# Patient Record
Sex: Female | Born: 1970 | Race: White | Hispanic: No | Marital: Married | Smoking: Never smoker
Health system: Southern US, Community
[De-identification: ages and names within clinical notes are randomized; demographics above are authoritative.]

## PROBLEM LIST (undated history)

## (undated) HISTORY — PX: HYSTEROSCOPY WITH D & C: SHX1775

## (undated) HISTORY — PX: ENDOMETRIAL ABLATION: SHX621

---

## 2010-05-12 ENCOUNTER — Ambulatory Visit: Payer: Self-pay | Admitting: Obstetrics & Gynecology

## 2010-05-18 ENCOUNTER — Ambulatory Visit: Payer: Self-pay | Admitting: Obstetrics & Gynecology

## 2010-05-20 LAB — PATHOLOGY REPORT

## 2014-10-02 LAB — HM PAP SMEAR

## 2019-07-24 ENCOUNTER — Other Ambulatory Visit (HOSPITAL_COMMUNITY)
Admission: RE | Admit: 2019-07-24 | Discharge: 2019-07-24 | Disposition: A | Discharge status: Home / Self Care | Payer: BC Managed Care – PPO | Source: Ambulatory Visit | Attending: Obstetrics and Gynecology | Admitting: Obstetrics and Gynecology

## 2019-07-24 ENCOUNTER — Encounter: Payer: Self-pay | Admitting: Obstetrics and Gynecology

## 2019-07-24 ENCOUNTER — Ambulatory Visit (INDEPENDENT_AMBULATORY_CARE_PROVIDER_SITE_OTHER): Payer: BC Managed Care – PPO | Admitting: Obstetrics and Gynecology

## 2019-07-24 ENCOUNTER — Other Ambulatory Visit: Payer: Self-pay

## 2019-07-24 VITALS — BP 100/70 | Ht 66.0 in | Wt 181.0 lb

## 2019-07-24 DIAGNOSIS — Z124 Encounter for screening for malignant neoplasm of cervix: Secondary | ICD-10-CM | POA: Insufficient documentation

## 2019-07-24 DIAGNOSIS — Z6829 Body mass index (BMI) 29.0-29.9, adult: Secondary | ICD-10-CM

## 2019-07-24 DIAGNOSIS — Z13220 Encounter for screening for lipoid disorders: Secondary | ICD-10-CM

## 2019-07-24 DIAGNOSIS — Z01419 Encounter for gynecological examination (general) (routine) without abnormal findings: Secondary | ICD-10-CM

## 2019-07-24 DIAGNOSIS — Z131 Encounter for screening for diabetes mellitus: Secondary | ICD-10-CM

## 2019-07-24 DIAGNOSIS — Z0000 Encounter for general adult medical examination without abnormal findings: Secondary | ICD-10-CM

## 2019-07-24 DIAGNOSIS — Z1151 Encounter for screening for human papillomavirus (HPV): Secondary | ICD-10-CM | POA: Insufficient documentation

## 2019-07-24 DIAGNOSIS — Z1231 Encounter for screening mammogram for malignant neoplasm of breast: Secondary | ICD-10-CM

## 2019-07-24 NOTE — Progress Notes (Signed)
PCP:  Patient, No Pcp Per   Chief Complaint  Patient presents with  . Gynecologic Exam     HPI:      Ms. Leslie Carlson is a 48 y.o. No obstetric history on file. who LMP was Patient's last menstrual period was 07/19/2019 (exact date)., presents today for her NP> 3 yrs annual examination.  Her menses are monthly, last 2-3 days light flow, due to endometrial ablation. Dysmenorrhea mild, occurring first 1-2 days of flow. She does not have intermenstrual bleeding.  Sex activity: single partner, contraception - vasectomy.  Last Pap: October 02, 2014  Results were: no abnormalities /neg HPV DNA  Hx of STDs: none  Last mammogram: November 11, 2015  Results were: normal--routine follow-up in 12 months There is no FH of breast cancer. There is no FH of ovarian cancer. The patient does do self-breast exams.  Tobacco use: The patient denies current or previous tobacco use. Alcohol use: none No drug use.  Exercise: very active  She does get adequate calcium and Vitamin D in her diet.  Due for fasting labs.  History reviewed. No pertinent past medical history.  Past Surgical History:  Procedure Laterality Date  . ENDOMETRIAL ABLATION    . HYSTEROSCOPY W/D&C      Family History  Problem Relation Age of Onset  . Heart disease Father 38    Social History   Socioeconomic History  . Marital status: Unknown    Spouse name: Not on file  . Number of children: Not on file  . Years of education: Not on file  . Highest education level: Not on file  Occupational History  . Not on file  Social Needs  . Financial resource strain: Not on file  . Food insecurity    Worry: Not on file    Inability: Not on file  . Transportation needs    Medical: Not on file    Non-medical: Not on file  Tobacco Use  . Smoking status: Never Smoker  . Smokeless tobacco: Never Used  Substance and Sexual Activity  . Alcohol use: Never    Frequency: Never  . Drug use: Never  . Sexual activity:  Yes    Birth control/protection: None, Surgical    Comment: Ablation/Vasectomy  Lifestyle  . Physical activity    Days per week: Not on file    Minutes per session: Not on file  . Stress: Not on file  Relationships  . Social Herbalist on phone: Not on file    Gets together: Not on file    Attends religious service: Not on file    Active member of club or organization: Not on file    Attends meetings of clubs or organizations: Not on file    Relationship status: Not on file  . Intimate partner violence    Fear of current or ex partner: Not on file    Emotionally abused: Not on file    Physically abused: Not on file    Forced sexual activity: Not on file  Other Topics Concern  . Not on file  Social History Narrative  . Not on file    No current outpatient medications on file.     ROS:  Review of Systems  Constitutional: Negative for fatigue, fever and unexpected weight change.  Respiratory: Negative for cough, shortness of breath and wheezing.   Cardiovascular: Negative for chest pain, palpitations and leg swelling.  Gastrointestinal: Negative for blood in stool, constipation, diarrhea,  nausea and vomiting.  Endocrine: Negative for cold intolerance, heat intolerance and polyuria.  Genitourinary: Negative for dyspareunia, dysuria, flank pain, frequency, genital sores, hematuria, menstrual problem, pelvic pain, urgency, vaginal bleeding, vaginal discharge and vaginal pain.  Musculoskeletal: Negative for back pain, joint swelling and myalgias.  Skin: Negative for rash.  Neurological: Negative for dizziness, syncope, light-headedness, numbness and headaches.  Hematological: Negative for adenopathy.  Psychiatric/Behavioral: Negative for agitation, confusion, sleep disturbance and suicidal ideas. The patient is not nervous/anxious.    BREAST: No symptoms   Objective: BP 100/70   Ht 5' 6"  (1.676 m)   Wt 181 lb (82.1 kg)   LMP 07/19/2019 (Exact Date)   BMI  29.21 kg/m    Physical Exam Constitutional:      Appearance: She is well-developed.  Genitourinary:     Vulva, vagina, cervix, uterus, right adnexa and left adnexa normal.     No vulval lesion or tenderness noted.     No vaginal discharge, erythema or tenderness.     No cervical polyp.     Uterus is not enlarged or tender.     No right or left adnexal mass present.     Right adnexa not tender.     Left adnexa not tender.  Neck:     Musculoskeletal: Normal range of motion.     Thyroid: No thyromegaly.  Cardiovascular:     Rate and Rhythm: Normal rate and regular rhythm.     Heart sounds: Normal heart sounds. No murmur.  Pulmonary:     Effort: Pulmonary effort is normal.     Breath sounds: Normal breath sounds.  Chest:     Breasts:        Right: No mass, nipple discharge, skin change or tenderness.        Left: No mass, nipple discharge, skin change or tenderness.  Abdominal:     Palpations: Abdomen is soft.     Tenderness: There is no abdominal tenderness. There is no guarding.  Musculoskeletal: Normal range of motion.  Neurological:     General: No focal deficit present.     Mental Status: She is alert and oriented to person, place, and time.     Cranial Nerves: No cranial nerve deficit.  Skin:    General: Skin is warm and dry.  Psychiatric:        Mood and Affect: Mood normal.        Behavior: Behavior normal.        Thought Content: Thought content normal.        Judgment: Judgment normal.  Vitals signs reviewed.     Assessment/Plan: Encounter for annual routine gynecological examination  Cervical cancer screening - Plan: Cytology - PAP  Screening for HPV (human papillomavirus) - Plan: Cytology - PAP  Encounter for screening mammogram for malignant neoplasm of breast - Plan: MM 3D SCREEN BREAST BILATERAL; pt to sched mammo  Blood tests for routine general physical examination - Plan: Comprehensive metabolic panel, Lipid panel, Hemoglobin A1c  Screening  cholesterol level - Plan: Lipid panel  Screening for diabetes mellitus - Plan: Hemoglobin A1c  BMI 29.0-29.9,adult - Plan: Comprehensive metabolic panel, Lipid panel, Hemoglobin A1c     GYN counsel breast self exam, mammography screening, menopause, adequate intake of calcium and vitamin D, diet and exercise     F/U  Return in about 1 year (around 07/23/2020).  Nekoda Chock B. Deionna Marcantonio, PA-C 07/24/2019 3:24 PM

## 2019-07-24 NOTE — Patient Instructions (Signed)
I value your feedback and entrusting [place] with your care. If you get a  patient survey, I would appreciate you taking the time to let us know about your experience today. Thank you!  [place] [facility]: [place]: [phone]

## 2019-07-25 ENCOUNTER — Other Ambulatory Visit: Payer: Self-pay

## 2019-07-25 DIAGNOSIS — Z131 Encounter for screening for diabetes mellitus: Secondary | ICD-10-CM

## 2019-07-25 DIAGNOSIS — Z0000 Encounter for general adult medical examination without abnormal findings: Secondary | ICD-10-CM

## 2019-07-25 DIAGNOSIS — Z13220 Encounter for screening for lipoid disorders: Secondary | ICD-10-CM

## 2019-07-25 DIAGNOSIS — Z6829 Body mass index (BMI) 29.0-29.9, adult: Secondary | ICD-10-CM

## 2019-07-30 LAB — CYTOLOGY - PAP
Diagnosis: NEGATIVE
High risk HPV: NEGATIVE

## 2019-08-01 ENCOUNTER — Other Ambulatory Visit: Payer: Self-pay | Admitting: Obstetrics and Gynecology

## 2019-08-02 LAB — COMPREHENSIVE METABOLIC PANEL
ALT: 13 IU/L (ref 0–32)
AST: 14 IU/L (ref 0–40)
Albumin/Globulin Ratio: 1.9 (ref 1.2–2.2)
Albumin: 4.4 g/dL (ref 3.8–4.8)
Alkaline Phosphatase: 43 IU/L (ref 39–117)
BUN/Creatinine Ratio: 15 (ref 9–23)
BUN: 11 mg/dL (ref 6–24)
Bilirubin Total: 0.2 mg/dL (ref 0.0–1.2)
CO2: 26 mmol/L (ref 20–29)
Calcium: 9.8 mg/dL (ref 8.7–10.2)
Chloride: 103 mmol/L (ref 96–106)
Creatinine, Ser: 0.74 mg/dL (ref 0.57–1.00)
GFR calc Af Amer: 112 mL/min/{1.73_m2} (ref >59)
GFR calc non Af Amer: 97 mL/min/{1.73_m2} (ref >59)
Globulin, Total: 2.3 g/dL (ref 1.5–4.5)
Glucose: 92 mg/dL (ref 65–99)
Potassium: 4.6 mmol/L (ref 3.5–5.2)
Sodium: 140 mmol/L (ref 134–144)
Total Protein: 6.7 g/dL (ref 6.0–8.5)

## 2019-08-02 LAB — LIPID PANEL
Chol/HDL Ratio: 3.4 ratio (ref 0.0–4.4)
Cholesterol, Total: 180 mg/dL (ref 100–199)
HDL: 53 mg/dL (ref >39)
LDL Chol Calc (NIH): 112 mg/dL — ABNORMAL HIGH (ref 0–99)
Triglycerides: 78 mg/dL (ref 0–149)
VLDL Cholesterol Cal: 15 mg/dL (ref 5–40)

## 2019-08-02 LAB — HEMOGLOBIN A1C
Est. average glucose Bld gHb Est-mCnc: 105 mg/dL
Hgb A1c MFr Bld: 5.3 % (ref 4.8–5.6)

## 2019-08-02 NOTE — Progress Notes (Signed)
Pls let pt know labs normal. Thx

## 2019-08-02 NOTE — Progress Notes (Signed)
Called pt, no answer, LVMTRC.

## 2019-08-02 NOTE — Progress Notes (Signed)
Pt aware.

## 2019-08-07 ENCOUNTER — Other Ambulatory Visit: Payer: Self-pay

## 2019-08-07 ENCOUNTER — Ambulatory Visit
Admission: RE | Admit: 2019-08-07 | Discharge: 2019-08-07 | Disposition: A | Discharge status: Home / Self Care | Payer: BC Managed Care – PPO | Source: Ambulatory Visit | Attending: Obstetrics and Gynecology | Admitting: Obstetrics and Gynecology

## 2019-08-07 DIAGNOSIS — Z1231 Encounter for screening mammogram for malignant neoplasm of breast: Secondary | ICD-10-CM | POA: Diagnosis present

## 2019-08-14 ENCOUNTER — Other Ambulatory Visit: Payer: Self-pay | Admitting: Obstetrics and Gynecology

## 2019-08-14 DIAGNOSIS — N6489 Other specified disorders of breast: Secondary | ICD-10-CM

## 2019-08-14 DIAGNOSIS — R928 Other abnormal and inconclusive findings on diagnostic imaging of breast: Secondary | ICD-10-CM

## 2019-08-22 ENCOUNTER — Ambulatory Visit
Admission: RE | Admit: 2019-08-22 | Discharge: 2019-08-22 | Disposition: A | Discharge status: Home / Self Care | Payer: BC Managed Care – PPO | Source: Ambulatory Visit | Attending: Obstetrics and Gynecology | Admitting: Obstetrics and Gynecology

## 2019-08-22 DIAGNOSIS — R928 Other abnormal and inconclusive findings on diagnostic imaging of breast: Secondary | ICD-10-CM

## 2019-08-22 DIAGNOSIS — N6489 Other specified disorders of breast: Secondary | ICD-10-CM

## 2019-08-27 ENCOUNTER — Encounter: Payer: Self-pay | Admitting: Obstetrics and Gynecology

## 2019-12-21 ENCOUNTER — Ambulatory Visit: Payer: BC Managed Care – PPO | Attending: Internal Medicine

## 2019-12-21 DIAGNOSIS — Z23 Encounter for immunization: Secondary | ICD-10-CM | POA: Insufficient documentation

## 2019-12-21 NOTE — Progress Notes (Signed)
   Covid-19 Vaccination Clinic  Name:  Leslie Carlson    MRN: [id number] DOB: 10-04-1971  12/21/2019  Leslie Carlson was observed post Covid-19 immunization for 15 minutes without incident. She was provided with Vaccine Information Sheet and instruction to access the V-Safe system.   Leslie Carlson was instructed to call 911 with any severe reactions post vaccine: Marland Kitchen Difficulty breathing  . Swelling of face and throat  . A fast heartbeat  . A bad rash all over body  . Dizziness and weakness   Immunizations Administered    Name Date Dose VIS Date Route   Moderna COVID-19 Vaccine 12/21/2019  2:18 PM 0.5 mL 09/17/2019 Intramuscular   Manufacturer: Moderna   Lot: [id number]   [place]: [id number]

## 2020-01-07 ENCOUNTER — Other Ambulatory Visit: Payer: Self-pay | Admitting: Obstetrics and Gynecology

## 2020-01-07 ENCOUNTER — Telehealth: Payer: Self-pay

## 2020-01-07 DIAGNOSIS — N6320 Unspecified lump in the left breast, unspecified quadrant: Secondary | ICD-10-CM

## 2020-01-07 NOTE — Telephone Encounter (Signed)
Pt called triage stating that she needs to have a recheck of her breast due to them finding a spot they wanted to check again in 6 months. Pt states she is needing an order from [facility] so that she can have the other images done since it is time for the 6 month check up. Thank you

## 2020-01-07 NOTE — Telephone Encounter (Signed)
Pls notify pt order is in system and she can scheduled at [facility]. Thx.

## 2020-01-08 NOTE — Telephone Encounter (Signed)
Pt aware.

## 2020-01-18 ENCOUNTER — Ambulatory Visit: Payer: BC Managed Care – PPO | Attending: Internal Medicine

## 2020-01-18 ENCOUNTER — Other Ambulatory Visit: Payer: Self-pay

## 2020-01-18 DIAGNOSIS — Z23 Encounter for immunization: Secondary | ICD-10-CM

## 2020-01-18 NOTE — Progress Notes (Signed)
   Covid-19 Vaccination Clinic  Name:  JACQULENE HUNTLEY    MRN: [id number] DOB: 11-07-1970  01/18/2020  Ms. Heffern was observed post Covid-19 immunization for 15 minutes without incident. She was provided with Vaccine Information Sheet and instruction to access the V-Safe system.   Ms. Dowda was instructed to call 911 with any severe reactions post vaccine: Marland Kitchen Difficulty breathing  . Swelling of face and throat  . A fast heartbeat  . A bad rash all over body  . Dizziness and weakness   Immunizations Administered    Name Date Dose VIS Date Route   Moderna COVID-19 Vaccine 01/18/2020  9:06 AM 0.5 mL 09/17/2019 Intramuscular   Manufacturer: Levan Hurst   Lot: [id number]   [place]: [id number]

## 2020-02-20 ENCOUNTER — Encounter: Payer: Self-pay | Admitting: Obstetrics and Gynecology

## 2020-02-20 ENCOUNTER — Ambulatory Visit
Admission: RE | Admit: 2020-02-20 | Discharge: 2020-02-20 | Disposition: A | Discharge status: Home / Self Care | Payer: BC Managed Care – PPO | Source: Ambulatory Visit | Attending: Obstetrics and Gynecology | Admitting: Obstetrics and Gynecology

## 2020-02-20 DIAGNOSIS — N6320 Unspecified lump in the left breast, unspecified quadrant: Secondary | ICD-10-CM

## 2020-04-29 ENCOUNTER — Ambulatory Visit
Admission: EM | Admit: 2020-04-29 | Discharge: 2020-04-29 | Disposition: A | Discharge status: Home / Self Care | Payer: BC Managed Care – PPO | Attending: Family Medicine | Admitting: Family Medicine

## 2020-04-29 ENCOUNTER — Other Ambulatory Visit: Payer: Self-pay

## 2020-04-29 DIAGNOSIS — M5410 Radiculopathy, site unspecified: Secondary | ICD-10-CM

## 2020-04-29 DIAGNOSIS — M545 Low back pain, unspecified: Secondary | ICD-10-CM

## 2020-04-29 MED ORDER — PREDNISONE 10 MG PO TABS: ORAL_TABLET | ORAL | 0 refills | Status: DC

## 2020-04-29 MED ORDER — CYCLOBENZAPRINE HCL 10 MG PO TABS: 10.0000 mg | ORAL_TABLET | Freq: Two times a day (BID) | ORAL | 0 refills | Status: DC | PRN

## 2020-04-29 NOTE — Discharge Instructions (Addendum)
Take medication as prescribed. Rest. Drink plenty of fluids. Ice/Heat. Stretch. Massage. Monitor.   Follow up with your primary care physician this week as needed. Return to Urgent care or ER for new or worsening concerns.

## 2020-04-29 NOTE — ED Triage Notes (Signed)
Patient states that she has a pinched nerve in her back at the base of shoulder blade  that has caused some pain in left arm x 3 days. States that she tried to massage it out with a tennis ball without success.

## 2020-04-29 NOTE — ED Provider Notes (Signed)
[facility] ____________________________________________  Time seen: Approximately 4:12 PM  I have reviewed the triage vital signs and the nursing notes.   HISTORY  Chief Complaint Back Pain  HPI Leslie Carlson is a 49 y.o. female presenting for evaluation of left back pain present for the last 3 to 4 days.  Patient reports initially she woke up with what felt like a crick in her neck.  Patient states she is unsure if she lifted something causing it or slept wrong.  States pain is worse with movement, and reproduced with movement.  States pain is gradually worsened and now sore with movement and palpation near her shoulder blade.  States when she twist her neck to her left she feels the pain shoot down her left arm.  States pain shooting down her arm is aching and sometimes tingly, denies loss of sensation.  Denies decreased range of motion.  States she has had same to happen in the past.  States painful to sleep on her sides at night and has to sleep on her back.  States heating pad and laying on her back at night helps.  Denies fall, direct injury.  Denies any chest pain, shortness of breath, neck pain, pain from back to chest, rash, other injury.  Reports otherwise doing well.  Denies diabetes, hypertension, hyperlipidemia, cardiac history or smoking.  Copland, Deirdre Evener, PA-C : PCP   History reviewed. No pertinent past medical history.  There are no problems to display for this patient.   Past Surgical History:  Procedure Laterality Date  . ENDOMETRIAL ABLATION    . HYSTEROSCOPY WITH D & C       No current facility-administered medications for this encounter.  Current Outpatient Medications:  .  cyclobenzaprine (FLEXERIL) 10 MG tablet, Take 1 tablet (10 mg total) by mouth 2 (two) times daily as needed for muscle spasms. Do not drive while taking as can cause drowsiness, Disp: 15 tablet, Rfl: 0 .  predniSONE (DELTASONE) 10 MG tablet, Start 60 mg po day one, then  50 mg po day two, taper by 10 mg daily until complete., Disp: 21 tablet, Rfl: 0  Allergies Sulfamethoxazole-trimethoprim  Family History  Problem Relation Age of Onset  . Heart disease Father 85  . Breast cancer Neg Hx     Social History Social History   Tobacco Use  . Smoking status: Never Smoker  . Smokeless tobacco: Never Used  Vaping Use  . Vaping Use: Never used  Substance Use Topics  . Alcohol use: Never  . Drug use: Never    Review of Systems Constitutional: No fever Cardiovascular: Denies chest pain. Respiratory: Denies shortness of breath. Gastrointestinal: No abdominal pain.  No nausea, no vomiting.   Musculoskeletal: Positive for back pain. Skin: Negative for rash. Neurological: Negative for headaches or focal weakness.   ____________________________________________   PHYSICAL EXAM:  VITAL SIGNS: ED Triage Vitals  Enc Vitals Group     BP 04/29/20 1531 117/74     Pulse Rate 04/29/20 1531 64     Resp 04/29/20 1531 18     Temp 04/29/20 1531 99 F (37.2 C)     Temp Source 04/29/20 1531 Oral     SpO2 04/29/20 1531 100 %     Weight 04/29/20 1529 180 lb (81.6 kg)     Height 04/29/20 1529 5' 6"  (1.676 m)     Head Circumference --      Peak Flow --      Pain Score 04/29/20  1529 7     Pain Loc --      Pain Edu? --      Excl. in [place]? --     Constitutional: Alert and oriented. Well appearing and in no acute distress. Eyes: Conjunctivae are normal.  ENT      Head: Normocephalic and atraumatic. Cardiovascular: Normal rate, regular rhythm. Grossly normal heart sounds.  Good peripheral circulation. Respiratory: Normal respiratory effort without tachypnea nor retractions. Breath sounds are clear and equal bilaterally. No wheezes, rales, rhonchi. Gastrointestinal: Soft and nontender. No distention. Normal Bowel sounds. No CVA tenderness. Musculoskeletal: Steady gait.  No midline cervical, thoracic or lumbar tenderness palpation.  Bilateral hand grip strong and  equal.  Bilateral distal radial pulses equal. Except: Moderate tenderness to direct palpation medial left scapula with palpable muscle knot as well as tenderness to lower trapezius, no midline tenderness, left arm with full range of motion present and normal distal sensation, pain fully reproducible per patient by direct palpation. Neurologic:  Normal speech and language. No gross focal neurologic deficits are appreciated. Speech is normal. No gait instability.  Skin:  Skin is warm, dry and intact. No rash noted. Psychiatric: Mood and affect are normal. Speech and behavior are normal. Patient exhibits appropriate insight and judgment   ___________________________________________   LABS (all labs ordered are listed, but only abnormal results are displayed)  Labs Reviewed - No data to display ____________________________________________  EKG  Pt declined  RADIOLOGY  No results found. ____________________________________________   PROCEDURES Procedures    INITIAL IMPRESSION / ASSESSMENT AND PLAN / ED COURSE  Pertinent labs & imaging results that were available during my care of the patient were reviewed by me and considered in my medical decision making (see chart for details).  Well-appearing patient.  No acute distress.  Suspect musculoskeletal pain with radiculopathy.  Pain fully reproducible by direct palpation.  Discussed evaluation of EKG at this time, patient declined and verbalized understanding.  Will treat with oral prednisone and Flexeril.  Ice heat.  Massage, stretching, supportive care.  Discussed very strict follow-up and return parameters.Discussed indication, risks and benefits of medications with patient.   Discussed follow up with Primary care physician this week. Discussed follow up and return parameters including no resolution or any worsening concerns. Patient verbalized understanding and agreed to plan.   ____________________________________________   FINAL  CLINICAL IMPRESSION(S) / ED DIAGNOSES  Final diagnoses:  Acute left-sided low back pain without sciatica  Radiculopathy, unspecified spinal region     ED Discharge Orders         Ordered    predniSONE (DELTASONE) 10 MG tablet     Discontinue  Reprint     04/29/20 1610    cyclobenzaprine (FLEXERIL) 10 MG tablet  2 times daily PRN     Discontinue  Reprint     04/29/20 1610           Note: This dictation was prepared with Dragon dictation along with smaller phrase technology. Any transcriptional errors that result from this process are unintentional.         Marylene Land, NP 04/29/20 1630

## 2020-07-17 DIAGNOSIS — Z1211 Encounter for screening for malignant neoplasm of colon: Secondary | ICD-10-CM

## 2020-07-17 HISTORY — DX: Encounter for screening for malignant neoplasm of colon: Z12.11

## 2020-07-28 ENCOUNTER — Encounter: Payer: Self-pay | Admitting: Obstetrics and Gynecology

## 2020-07-28 ENCOUNTER — Ambulatory Visit (INDEPENDENT_AMBULATORY_CARE_PROVIDER_SITE_OTHER): Payer: BC Managed Care – PPO | Admitting: Obstetrics and Gynecology

## 2020-07-28 ENCOUNTER — Other Ambulatory Visit: Payer: Self-pay

## 2020-07-28 VITALS — BP 110/70 | Ht 66.0 in | Wt 183.0 lb

## 2020-07-28 DIAGNOSIS — Z1211 Encounter for screening for malignant neoplasm of colon: Secondary | ICD-10-CM

## 2020-07-28 DIAGNOSIS — Z1231 Encounter for screening mammogram for malignant neoplasm of breast: Secondary | ICD-10-CM

## 2020-07-28 DIAGNOSIS — R928 Other abnormal and inconclusive findings on diagnostic imaging of breast: Secondary | ICD-10-CM

## 2020-07-28 DIAGNOSIS — Z01419 Encounter for gynecological examination (general) (routine) without abnormal findings: Secondary | ICD-10-CM

## 2020-07-28 NOTE — Patient Instructions (Signed)
I value your feedback and entrusting [place] with your care. If you get a [facility] patient survey, I would appreciate you taking the time to let us know about your experience today. Thank you!  As of September 26, 2019, your lab results will be released to your MyChart immediately, before I even have a chance to see them. Please give me time to review them and contact you if there are any abnormalities. Thank you for your patience.

## 2020-07-28 NOTE — Progress Notes (Signed)
PCP:  Patient, No Pcp Per   Chief Complaint  Patient presents with   Gynecologic Exam     HPI:      Ms. Leslie Carlson is a 49 y.o. No obstetric history on file. who LMP was No LMP recorded. Patient has had an ablation., presents today for her annual examination.  Her menses are monthly, last 2-3 days light flow, due to endometrial ablation. Dysmenorrhea mild, occurring first 1-2 days of flow. She does not have intermenstrual bleeding.  Sex activity: single partner, contraception - vasectomy.  Last Pap: 07/24/19  Results were: no abnormalities /neg HPV DNA  Hx of STDs: none  Last mammogram: 08/22/19  Results were: cat 3 LT breast. Had repeat u/s LT breast 5/21 that was stable. Repeat recommended in 11/21. There is no FH of breast cancer. There is no FH of ovarian cancer. The patient does do self-breast exams.  Tobacco use: The patient denies current or previous tobacco use. Alcohol use: none No drug use.  Exercise: very active  Colonoscopy: never; mom with non-cancerous polyps  She does get adequate calcium and Vitamin D in her diet.  Normal fasting labs 10/20.  History reviewed. No pertinent past medical history.  Past Surgical History:  Procedure Laterality Date   ENDOMETRIAL ABLATION     HYSTEROSCOPY WITH D & C      Family History  Problem Relation Age of Onset   Heart disease Father 49   Breast cancer Neg Hx     Social History   Socioeconomic History   Marital status: Married    Spouse name: Not on file   Number of children: Not on file   Years of education: Not on file   Highest education level: Not on file  Occupational History   Not on file  Tobacco Use   Smoking status: Never Smoker   Smokeless tobacco: Never Used  Vaping Use   Vaping Use: Never used  Substance and Sexual Activity   Alcohol use: Never   Drug use: Never   Sexual activity: Yes    Birth control/protection: None, Surgical    Comment: Ablation/Vasectomy  Other  Topics Concern   Not on file  Social History Narrative   Not on file   Social Determinants of Health   Financial Resource Strain:    Difficulty of Paying Living Expenses: Not on file  Food Insecurity:    Worried About Charity fundraiser in the Last Year: Not on file   [employer] of Food in the Last Year: Not on file  Transportation Needs:    Lack of Transportation (Medical): Not on file   Lack of Transportation (Non-Medical): Not on file  Physical Activity:    Days of Exercise per Week: Not on file   Minutes of Exercise per Session: Not on file  Stress:    Feeling of Stress : Not on file  Social Connections:    Frequency of Communication with Friends and Family: Not on file   Frequency of Social Gatherings with Friends and Family: Not on file   Attends Religious Services: Not on file   Active Member of Clubs or Organizations: Not on file   Attends Archivist Meetings: Not on file   Marital Status: Not on file  Intimate Partner Violence:    Fear of Current or Ex-Partner: Not on file   Emotionally Abused: Not on file   Physically Abused: Not on file   Sexually Abused: Not on file  No current outpatient medications on file.     ROS:  Review of Systems  Constitutional: Negative for fatigue, fever and unexpected weight change.  Respiratory: Negative for cough, shortness of breath and wheezing.   Cardiovascular: Negative for chest pain, palpitations and leg swelling.  Gastrointestinal: Negative for blood in stool, constipation, diarrhea, nausea and vomiting.  Endocrine: Negative for cold intolerance, heat intolerance and polyuria.  Genitourinary: Negative for dyspareunia, dysuria, flank pain, frequency, genital sores, hematuria, menstrual problem, pelvic pain, urgency, vaginal bleeding, vaginal discharge and vaginal pain.  Musculoskeletal: Negative for back pain, joint swelling and myalgias.  Skin: Negative for rash.  Neurological: Negative  for dizziness, syncope, light-headedness, numbness and headaches.  Hematological: Negative for adenopathy.  Psychiatric/Behavioral: Negative for agitation, confusion, sleep disturbance and suicidal ideas. The patient is not nervous/anxious.   BREAST: No symptoms   Objective: BP 110/70    Ht 5' 6"  (1.676 m)    Wt 183 lb (83 kg)    BMI 29.54 kg/m    Physical Exam Constitutional:      Appearance: She is well-developed.  Genitourinary:     Vulva, vagina, cervix, uterus, right adnexa and left adnexa normal.     No vulval lesion or tenderness noted.     No vaginal discharge, erythema or tenderness.     No cervical polyp.     Uterus is not enlarged or tender.     No right or left adnexal mass present.     Right adnexa not tender.     Left adnexa not tender.  Neck:     Thyroid: No thyromegaly.  Cardiovascular:     Rate and Rhythm: Normal rate and regular rhythm.     Heart sounds: Normal heart sounds. No murmur heard.   Pulmonary:     Effort: Pulmonary effort is normal.     Breath sounds: Normal breath sounds.  Chest:     Breasts:        Right: No mass, nipple discharge, skin change or tenderness.        Left: No mass, nipple discharge, skin change or tenderness.  Abdominal:     Palpations: Abdomen is soft.     Tenderness: There is no abdominal tenderness. There is no guarding.  Musculoskeletal:        General: Normal range of motion.     Cervical back: Normal range of motion.  Neurological:     General: No focal deficit present.     Mental Status: She is alert and oriented to person, place, and time.     Cranial Nerves: No cranial nerve deficit.  Skin:    General: Skin is warm and dry.  Psychiatric:        Mood and Affect: Mood normal.        Behavior: Behavior normal.        Thought Content: Thought content normal.        Judgment: Judgment normal.  Vitals reviewed.     Assessment/Plan: Encounter for annual routine gynecological examination  Encounter for  screening mammogram for malignant neoplasm of breast - Plan: MM DIAG BREAST TOMO BILATERAL, US BREAST LTD UNI LEFT INC AXILLA  Abnormal mammogram - Plan: MM DIAG BREAST TOMO BILATERAL, US BREAST LTD UNI LEFT INC AXILLA; will sched dx mammo and u/s and call with appt info.  Screening for colon cancer - Plan: Cologuard; colonoscopy/cologuard discussed. Pt elects cologuard. Ref sent. Will f/u with results.     GYN counsel breast self exam, mammography screening,  adequate intake of calcium and vitamin D, diet and exercise     F/U  Return in about 1 year (around 07/28/2021).  Leslie Egger B. Talisa Petrak, PA-C 07/28/2020 4:55 PM

## 2020-08-12 LAB — COLOGUARD: Cologuard: NEGATIVE

## 2020-08-13 ENCOUNTER — Encounter: Payer: Self-pay | Admitting: Obstetrics and Gynecology

## 2020-08-13 ENCOUNTER — Telehealth: Payer: Self-pay

## 2020-08-13 NOTE — Telephone Encounter (Signed)
Called pt, no answer, LVMTRC. Cologuard test results NEGATIVE.

## 2020-08-14 ENCOUNTER — Other Ambulatory Visit: Payer: BC Managed Care – PPO

## 2020-08-14 NOTE — Telephone Encounter (Signed)
Pt aware.

## 2020-08-27 ENCOUNTER — Ambulatory Visit
Admission: RE | Admit: 2020-08-27 | Discharge: 2020-08-27 | Disposition: A | Discharge status: Home / Self Care | Payer: BC Managed Care – PPO | Source: Ambulatory Visit | Attending: Obstetrics and Gynecology | Admitting: Obstetrics and Gynecology

## 2020-08-27 ENCOUNTER — Other Ambulatory Visit: Payer: Self-pay

## 2020-08-27 DIAGNOSIS — R928 Other abnormal and inconclusive findings on diagnostic imaging of breast: Secondary | ICD-10-CM

## 2020-08-27 DIAGNOSIS — Z1231 Encounter for screening mammogram for malignant neoplasm of breast: Secondary | ICD-10-CM | POA: Diagnosis present

## 2020-10-31 IMAGING — MG DIGITAL SCREENING BILAT W/ TOMO W/ CAD
8 series · 8 of 24 positions shown · non-contrast
Comparison: Previous exam(s).

CLINICAL DATA: Screening.

EXAM:
DIGITAL SCREENING BILATERAL MAMMOGRAM WITH TOMO AND CAD

[L MLO synth-2D]
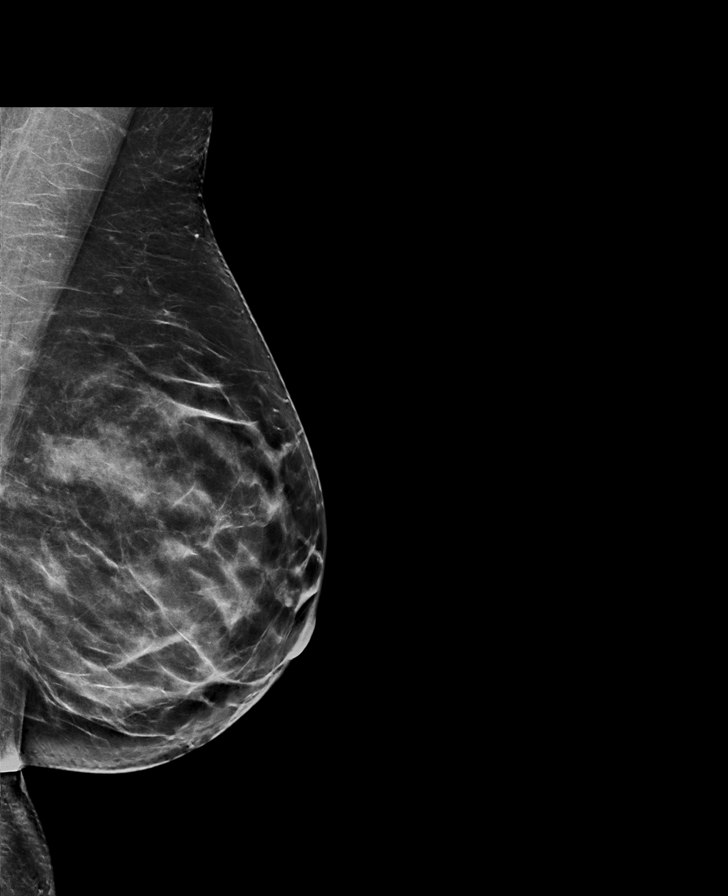

[L CC synth-2D]
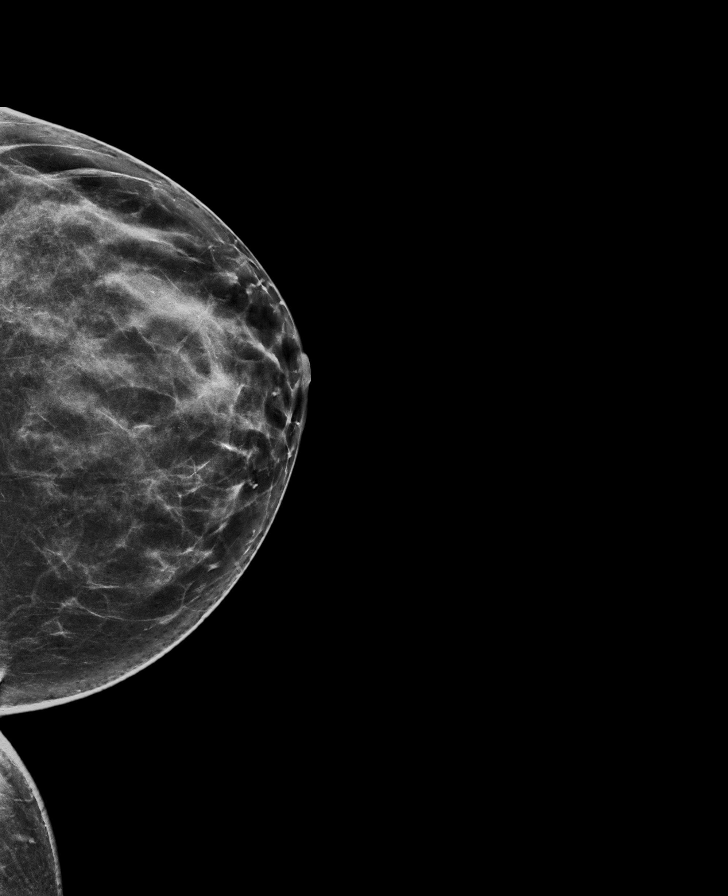

[R CC synth-2D]
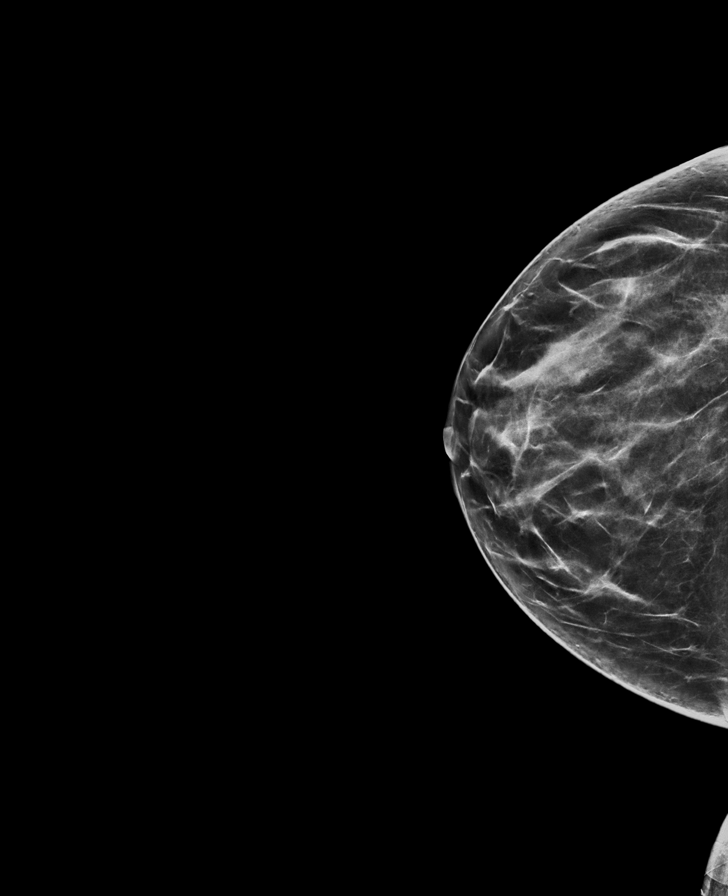

[R MLO synth-2D]
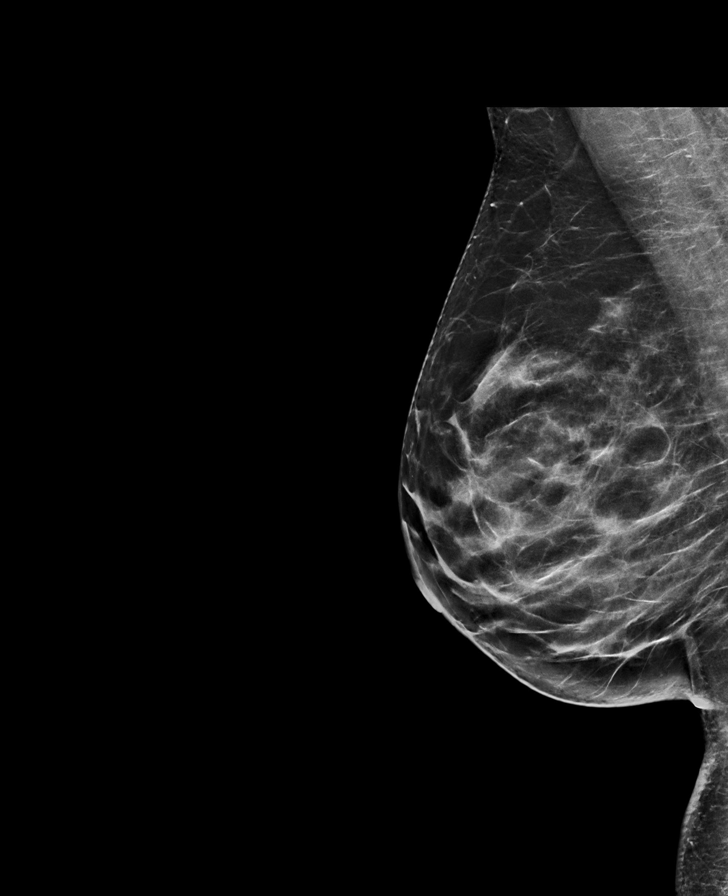

[R MLO tomo · tomo slice 39/76.0]
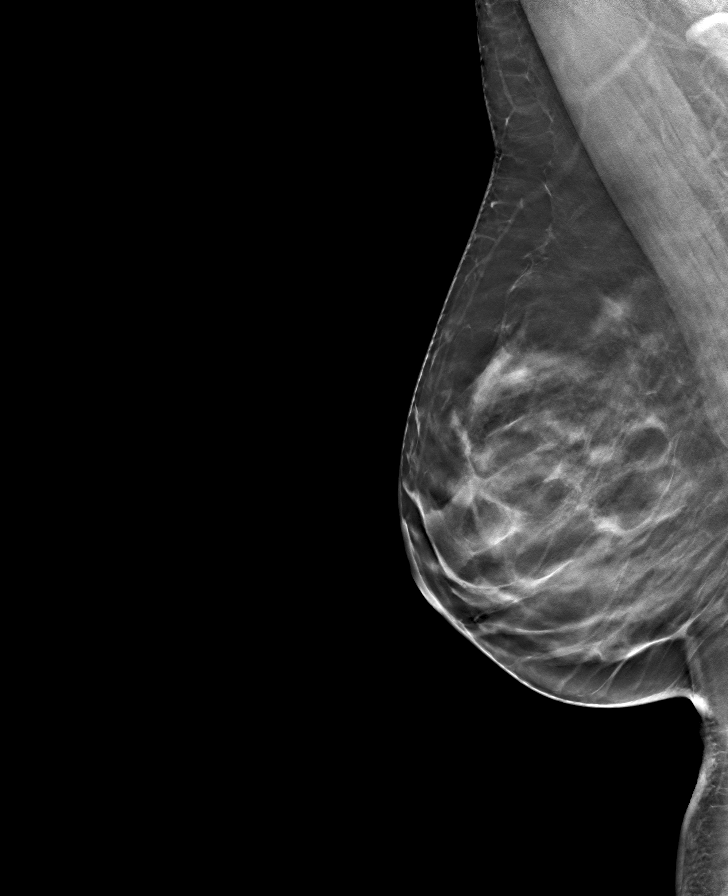

[L CC tomo · tomo slice 37/74.0]
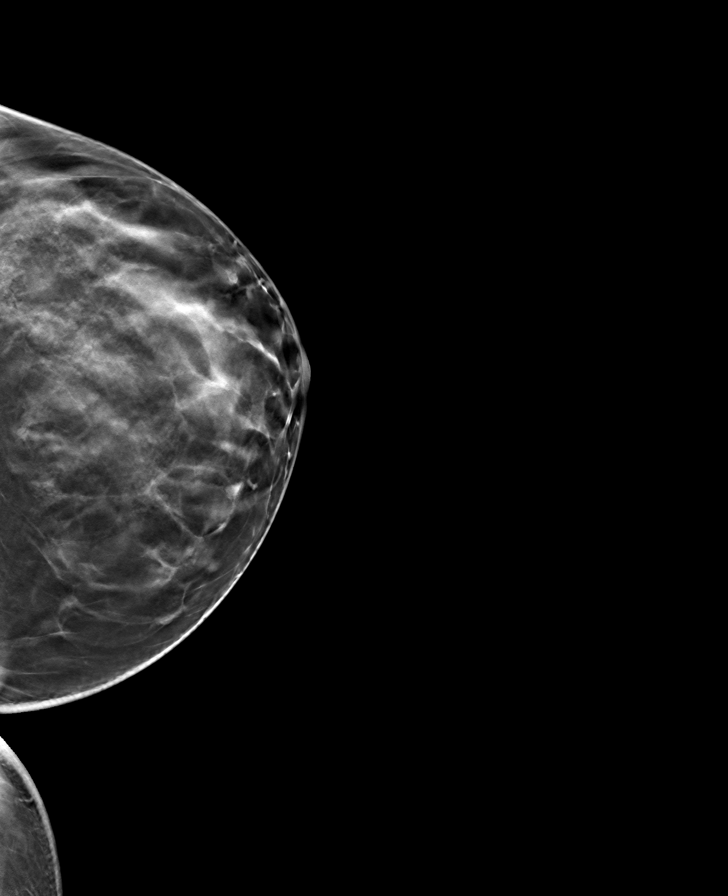

[R CC tomo · tomo slice 36/71.0]
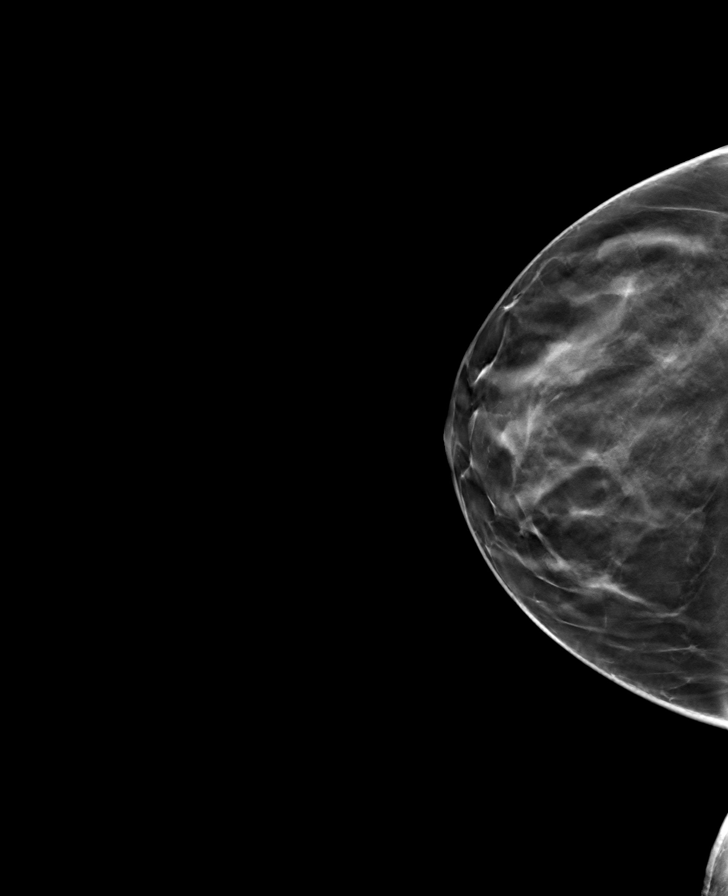

[L MLO tomo · tomo slice 41/82.0]
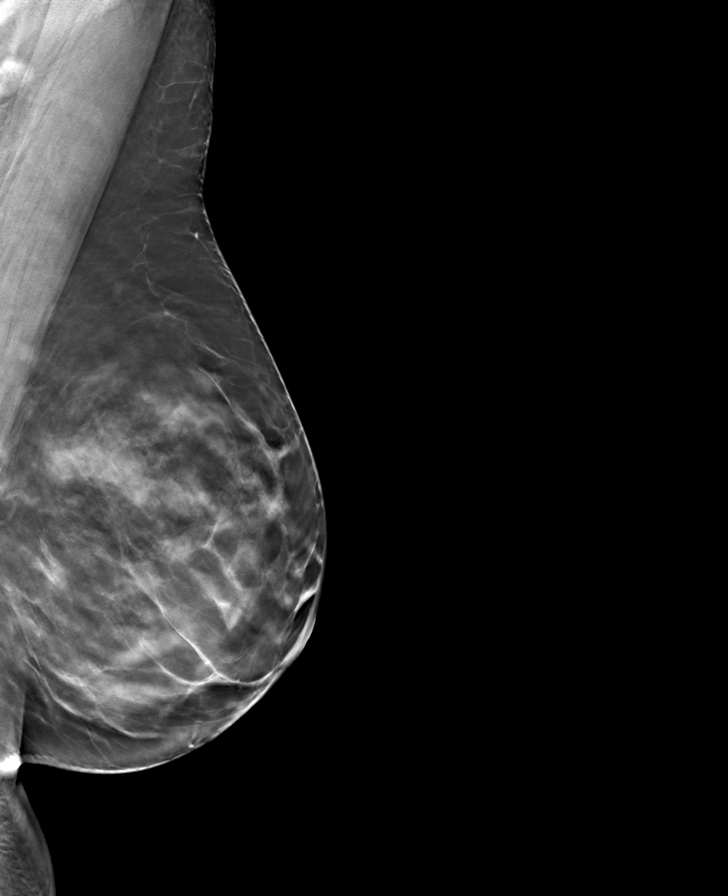

[8 of 24 positions shown; findings below may reference images not displayed]

ACR Breast Density Category c: The breast tissue is heterogeneously
dense, which may obscure small masses.
FINDINGS: In the left breast, a possible asymmetry warrants further
evaluation. This possible asymmetry is seen within the upper LEFT
breast, at posterior depth, MLO slice 64, suspected superimposition
of normal dense fibroglandular tissues based on tomosynthesis
images.

In the right breast, no findings suspicious for malignancy. Images
were processed with CAD.
IMPRESSION: Further evaluation is suggested for possible asymmetry in the left
breast.

RECOMMENDATION:
Diagnostic mammogram and possibly ultrasound of the left breast.
(Code:JJ-K-QQR)

The patient will be contacted regarding the findings, and additional
imaging will be scheduled.

BI-RADS CATEGORY  0: Incomplete. Need additional imaging evaluation
and/or prior mammograms for comparison.

## 2021-04-28 ENCOUNTER — Other Ambulatory Visit: Payer: Self-pay | Admitting: Obstetrics and Gynecology

## 2021-04-28 ENCOUNTER — Encounter: Payer: Self-pay | Admitting: Obstetrics and Gynecology

## 2021-04-28 MED ORDER — FLUCONAZOLE 150 MG PO TABS: 150.0000 mg | ORAL_TABLET | Freq: Once | ORAL | 0 refills | Status: AC

## 2021-04-28 NOTE — Progress Notes (Signed)
Rx diflucan for yeast vag at [employer] [facility]

## 2021-08-16 ENCOUNTER — Ambulatory Visit (INDEPENDENT_AMBULATORY_CARE_PROVIDER_SITE_OTHER): Payer: BC Managed Care – PPO | Admitting: Obstetrics and Gynecology

## 2021-08-16 ENCOUNTER — Other Ambulatory Visit: Payer: Self-pay

## 2021-08-16 ENCOUNTER — Encounter: Payer: Self-pay | Admitting: Obstetrics and Gynecology

## 2021-08-16 VITALS — BP 100/70 | Ht 66.0 in | Wt 184.0 lb

## 2021-08-16 DIAGNOSIS — Z1231 Encounter for screening mammogram for malignant neoplasm of breast: Secondary | ICD-10-CM

## 2021-08-16 DIAGNOSIS — Z01419 Encounter for gynecological examination (general) (routine) without abnormal findings: Secondary | ICD-10-CM

## 2021-08-16 DIAGNOSIS — N951 Menopausal and female climacteric states: Secondary | ICD-10-CM | POA: Diagnosis not present

## 2021-08-16 NOTE — Progress Notes (Signed)
PCP:  Patient, No Pcp Per (Inactive)   Chief Complaint  Patient presents with   Gynecologic Exam    No concerns     HPI:      Ms. Leslie Carlson is a 50 y.o. No obstetric history on file. who LMP was Patient's last menstrual period was 05/18/2021 (exact date)., presents today for her annual examination.  Her menses are monthly, last 2-3 days light flow, due to endometrial ablation. Dysmenorrhea mild, occurring first 1-2 days of flow. She does not have intermenstrual bleeding. Did skip 9/22 and 10/22 menses. Occas vasomotor sx.  Sex activity: single partner, contraception - vasectomy.  Last Pap: 07/24/19  Results were: no abnormalities /neg HPV DNA  Hx of STDs: none  Last mammogram: 08/27/20  Results were: normal, repeat in 12 months. 11/20 with cat 3 LT breast. Had repeat u/s LT breast 5/21 that was stable.  There is no FH of breast cancer. There is no FH of ovarian cancer. The patient does do self-breast exams.  Tobacco use: The patient denies current or previous tobacco use. Alcohol use: none No drug use.  Exercise: very active  Colonoscopy: never; mom with non-cancerous polyps; neg Cologuard 10/21, repeat due after 3 yrs  She does get adequate calcium and Vitamin D in her diet.  Normal fasting labs 10/20.  Past Medical History:  Diagnosis Date   Screening for colon cancer 07/2020   Cologuard NEG; repeat after 3 yrs    Past Surgical History:  Procedure Laterality Date   ENDOMETRIAL ABLATION     HYSTEROSCOPY WITH D & C      Family History  Problem Relation Age of Onset   Heart disease Father 65   Breast cancer Neg Hx     Social History   Socioeconomic History   Marital status: Married    Spouse name: Not on file   Number of children: Not on file   Years of education: Not on file   Highest education level: Not on file  Occupational History   Not on file  Tobacco Use   Smoking status: Never   Smokeless tobacco: Never  Vaping Use   Vaping Use:  Never used  Substance and Sexual Activity   Alcohol use: Never   Drug use: Never   Sexual activity: Yes    Birth control/protection: None, Surgical    Comment: Ablation/Vasectomy  Other Topics Concern   Not on file  Social History Narrative   Not on file   Social Determinants of Health   Financial Resource Strain: Not on file  Food Insecurity: Not on file  Transportation Needs: Not on file  Physical Activity: Not on file  Stress: Not on file  Social Connections: Not on file  Intimate Partner Violence: Not on file     Current Outpatient Medications:    Cholecalciferol 50 MCG (2000 UT) CAPS, Take by mouth., Disp: , Rfl:    cyanocobalamin 1000 MCG tablet, Take 2 tablets daily for 2 weeks, then reduce to 1 tablet daily thereafter for Vitamin B12 Deficiency., Disp: , Rfl:      ROS:  Review of Systems  Constitutional:  Negative for fatigue, fever and unexpected weight change.  Respiratory:  Negative for cough, shortness of breath and wheezing.   Cardiovascular:  Negative for chest pain, palpitations and leg swelling.  Gastrointestinal:  Negative for blood in stool, constipation, diarrhea, nausea and vomiting.  Endocrine: Negative for cold intolerance, heat intolerance and polyuria.  Genitourinary:  Negative for dyspareunia, dysuria,  flank pain, frequency, genital sores, hematuria, menstrual problem, pelvic pain, urgency, vaginal bleeding, vaginal discharge and vaginal pain.  Musculoskeletal:  Negative for back pain, joint swelling and myalgias.  Skin:  Negative for rash.  Neurological:  Negative for dizziness, syncope, light-headedness, numbness and headaches.  Hematological:  Negative for adenopathy.  Psychiatric/Behavioral:  Negative for agitation, confusion, sleep disturbance and suicidal ideas. The patient is not nervous/anxious.  BREAST: No symptoms   Objective: BP 100/70   Ht 5' 6"  (1.676 m)   Wt 184 lb (83.5 kg)   LMP 05/18/2021 (Exact Date)   BMI 29.70 kg/m     Physical Exam Constitutional:      Appearance: She is well-developed.  Genitourinary:     Vulva normal.     Right Labia: No rash, tenderness or lesions.    Left Labia: No tenderness, lesions or rash.    No vaginal discharge, erythema or tenderness.      Right Adnexa: not tender and no mass present.    Left Adnexa: not tender and no mass present.    No cervical friability or polyp.     Uterus is not enlarged or tender.  Breasts:    Right: No mass, nipple discharge, skin change or tenderness.     Left: No mass, nipple discharge, skin change or tenderness.  Neck:     Thyroid: No thyromegaly.  Cardiovascular:     Rate and Rhythm: Normal rate and regular rhythm.     Heart sounds: Normal heart sounds. No murmur heard. Pulmonary:     Effort: Pulmonary effort is normal.     Breath sounds: Normal breath sounds.  Abdominal:     Palpations: Abdomen is soft.     Tenderness: There is no abdominal tenderness. There is no guarding or rebound.  Musculoskeletal:        General: Normal range of motion.     Cervical back: Normal range of motion.  Lymphadenopathy:     Cervical: No cervical adenopathy.  Neurological:     General: No focal deficit present.     Mental Status: She is alert and oriented to person, place, and time.     Cranial Nerves: No cranial nerve deficit.  Skin:    General: Skin is warm and dry.  Psychiatric:        Mood and Affect: Mood normal.        Behavior: Behavior normal.        Thought Content: Thought content normal.        Judgment: Judgment normal.  Vitals reviewed.    Assessment/Plan:  Encounter for annual routine gynecological examination  Encounter for screening mammogram for malignant neoplasm of breast - Plan: MM 3D SCREEN BREAST BILATERAL; pt to sched mammo  Perimenopause--f/u prn AUB.      GYN counsel breast self exam, mammography screening, adequate intake of calcium and vitamin D, diet and exercise     F/U  Return in about 1 year  (around 08/16/2022).  Pablo Mathurin B. Atalie Oros, PA-C 08/16/2021 3:22 PM

## 2021-08-16 NOTE — Patient Instructions (Signed)
I value your feedback and you entrusting [place] with your care. If you get a [facility] patient survey, I would appreciate you taking the time to let us know about your experience today. Thank you!  [place] [facility]: [phone]

## 2021-09-16 ENCOUNTER — Ambulatory Visit
Admission: RE | Admit: 2021-09-16 | Discharge: 2021-09-16 | Disposition: A | Discharge status: Home / Self Care | Payer: BC Managed Care – PPO | Source: Ambulatory Visit | Attending: Obstetrics and Gynecology | Admitting: Obstetrics and Gynecology

## 2021-09-16 ENCOUNTER — Other Ambulatory Visit: Payer: Self-pay

## 2021-09-16 DIAGNOSIS — Z1231 Encounter for screening mammogram for malignant neoplasm of breast: Secondary | ICD-10-CM | POA: Diagnosis present

## 2021-10-21 ENCOUNTER — Encounter: Payer: Self-pay | Admitting: Obstetrics and Gynecology

## 2021-10-21 DIAGNOSIS — N946 Dysmenorrhea, unspecified: Secondary | ICD-10-CM

## 2021-11-12 ENCOUNTER — Ambulatory Visit: Payer: BC Managed Care – PPO

## 2021-11-16 ENCOUNTER — Ambulatory Visit: Payer: BC Managed Care – PPO

## 2021-11-22 ENCOUNTER — Other Ambulatory Visit: Payer: Self-pay | Admitting: Obstetrics and Gynecology

## 2021-11-22 DIAGNOSIS — R102 Pelvic and perineal pain: Secondary | ICD-10-CM

## 2021-11-22 DIAGNOSIS — N946 Dysmenorrhea, unspecified: Secondary | ICD-10-CM

## 2021-11-22 NOTE — Telephone Encounter (Signed)
New order placed

## 2021-11-22 NOTE — Telephone Encounter (Signed)
Let's see if we can do in office to save $. She prob got [facility] estimate.

## 2021-12-09 ENCOUNTER — Ambulatory Visit: Payer: BC Managed Care – PPO

## 2021-12-09 DIAGNOSIS — R102 Pelvic and perineal pain: Secondary | ICD-10-CM

## 2021-12-09 DIAGNOSIS — N946 Dysmenorrhea, unspecified: Secondary | ICD-10-CM

## 2022-02-25 ENCOUNTER — Encounter: Payer: Self-pay | Admitting: Obstetrics and Gynecology

## 2022-02-28 NOTE — Telephone Encounter (Signed)
Contacted patient via phone. Patient is scheduled for 03/01/22 for nurse visit

## 2022-02-28 NOTE — Telephone Encounter (Signed)
Pls schedule pt for appt or urine check with nurse. I don't see her message from last wk in Epic. Do you know anything about it? Thx.

## 2022-03-01 ENCOUNTER — Ambulatory Visit (INDEPENDENT_AMBULATORY_CARE_PROVIDER_SITE_OTHER): Payer: BC Managed Care – PPO

## 2022-03-01 DIAGNOSIS — R399 Unspecified symptoms and signs involving the genitourinary system: Secondary | ICD-10-CM

## 2022-03-01 LAB — POCT URINALYSIS DIPSTICK OB
Bilirubin, UA: NEGATIVE
Blood, UA: NEGATIVE
Glucose, UA: NEGATIVE
Ketones, UA: NEGATIVE
Nitrite, UA: NEGATIVE
POC,PROTEIN,UA: NEGATIVE
Spec Grav, UA: 1.01 (ref 1.010–1.025)
Urobilinogen, UA: 0.2 E.U./dL
pH, UA: 7.5 (ref 5.0–8.0)

## 2022-03-04 LAB — URINE CULTURE

## 2022-03-05 ENCOUNTER — Other Ambulatory Visit: Payer: Self-pay | Admitting: Obstetrics and Gynecology

## 2022-03-05 MED ORDER — NITROFURANTOIN MONOHYD MACRO 100 MG PO CAPS: 100.0000 mg | ORAL_CAPSULE | Freq: Two times a day (BID) | ORAL | 0 refills | Status: AC

## 2022-03-05 NOTE — Progress Notes (Signed)
Rx macrobid for UTI on C&S

## 2022-08-19 ENCOUNTER — Other Ambulatory Visit: Payer: Self-pay | Admitting: Obstetrics and Gynecology

## 2022-08-19 DIAGNOSIS — Z1231 Encounter for screening mammogram for malignant neoplasm of breast: Secondary | ICD-10-CM

## 2022-09-21 ENCOUNTER — Ambulatory Visit
Admission: RE | Admit: 2022-09-21 | Discharge: 2022-09-21 | Disposition: A | Discharge status: Home / Self Care | Payer: BC Managed Care – PPO | Source: Ambulatory Visit | Attending: Obstetrics and Gynecology | Admitting: Obstetrics and Gynecology

## 2022-09-21 DIAGNOSIS — Z1231 Encounter for screening mammogram for malignant neoplasm of breast: Secondary | ICD-10-CM | POA: Insufficient documentation

## 2022-09-22 ENCOUNTER — Other Ambulatory Visit: Payer: Self-pay | Admitting: Obstetrics and Gynecology

## 2022-09-22 DIAGNOSIS — R928 Other abnormal and inconclusive findings on diagnostic imaging of breast: Secondary | ICD-10-CM

## 2022-09-22 DIAGNOSIS — N63 Unspecified lump in unspecified breast: Secondary | ICD-10-CM

## 2022-09-29 ENCOUNTER — Ambulatory Visit (INDEPENDENT_AMBULATORY_CARE_PROVIDER_SITE_OTHER): Payer: BC Managed Care – PPO | Admitting: Obstetrics and Gynecology

## 2022-09-29 ENCOUNTER — Encounter: Payer: Self-pay | Admitting: Obstetrics and Gynecology

## 2022-09-29 VITALS — BP 100/60 | Ht 66.0 in | Wt 181.0 lb

## 2022-09-29 DIAGNOSIS — N898 Other specified noninflammatory disorders of vagina: Secondary | ICD-10-CM | POA: Diagnosis not present

## 2022-09-29 DIAGNOSIS — Z01411 Encounter for gynecological examination (general) (routine) with abnormal findings: Secondary | ICD-10-CM

## 2022-09-29 DIAGNOSIS — Z01419 Encounter for gynecological examination (general) (routine) without abnormal findings: Secondary | ICD-10-CM

## 2022-09-29 DIAGNOSIS — Z1231 Encounter for screening mammogram for malignant neoplasm of breast: Secondary | ICD-10-CM

## 2022-09-29 DIAGNOSIS — N946 Dysmenorrhea, unspecified: Secondary | ICD-10-CM

## 2022-09-29 DIAGNOSIS — N951 Menopausal and female climacteric states: Secondary | ICD-10-CM | POA: Diagnosis not present

## 2022-09-29 NOTE — Progress Notes (Signed)
PCP:  Patient, No Pcp Per   Chief Complaint  Patient presents with   Gynecologic Exam    No concerns     HPI:      Ms. Leslie Carlson is a 51 y.o. No obstetric history on file. who LMP was Patient's last menstrual period was 08/30/2022 (exact date)., presents today for her annual examination.  Her menses are irregular now (skipped 5/23-9/23), lasting 3-4 days, light flow but heavier than when monthly, hx of endometrial ablation. Dysmenorrhea mod (worse than when monthly menses), improved with heating pad/ibup. Has occas vasomotor sx.    Sex activity: single partner, contraception - vasectomy. No pain/bleeding. Does have vaginal dryness, using olive oil. Then develops ext burning/itching afterwards that lasts close to a wk. Treats with medicated douche and sx finally resolve. No vag d/c, odor with irritation sx.  Last Pap: 07/24/19  Results were: no abnormalities /neg HPV DNA  Hx of STDs: none  Last mammogram: 09/21/22  Results were: Birds 0 LT breast, addl imaging scheduled for 64/68/03. Pt doesn't feel any masses. 11/20 with cat 3 LT breast. Had repeat u/s LT breast 5/21 that was stable.  There is no FH of breast cancer. There is no FH of ovarian cancer. The patient does do self-breast exams.  Tobacco use: The patient denies current or previous tobacco use. Alcohol use: none No drug use.  Exercise: min active  Colonoscopy: never; mom with non-cancerous polyps; neg Cologuard 10/21, repeat due after 3 yrs  She does get adequate calcium and Vitamin D in her diet.  Labs with PCP  Past Medical History:  Diagnosis Date   Screening for colon cancer 07/2020   Cologuard NEG; repeat after 3 yrs    Past Surgical History:  Procedure Laterality Date   ENDOMETRIAL ABLATION     HYSTEROSCOPY WITH D & C      Family History  Problem Relation Age of Onset   Diabetes Father    Heart disease Father 40   Breast cancer Neg Hx     Social History   Socioeconomic History   Marital  status: Married    Spouse name: Not on file   Number of children: Not on file   Years of education: Not on file   Highest education level: Not on file  Occupational History   Not on file  Tobacco Use   Smoking status: Never   Smokeless tobacco: Never  Vaping Use   Vaping Use: Never used  Substance and Sexual Activity   Alcohol use: Never   Drug use: Never   Sexual activity: Yes    Birth control/protection: None, Surgical    Comment: Ablation/Vasectomy  Other Topics Concern   Not on file  Social History Narrative   Not on file   Social Determinants of Health   Financial Resource Strain: Not on file  Food Insecurity: Not on file  Transportation Needs: Not on file  Physical Activity: Not on file  Stress: Not on file  Social Connections: Not on file  Intimate Partner Violence: Not on file     Current Outpatient Medications:    Cholecalciferol 50 MCG (2000 UT) CAPS, Take by mouth., Disp: , Rfl:    cyanocobalamin 1000 MCG tablet, Take 2 tablets daily for 2 weeks, then reduce to 1 tablet daily thereafter for Vitamin B12 Deficiency., Disp: , Rfl:      ROS:  Review of Systems  Constitutional:  Negative for fatigue, fever and unexpected weight change.  Respiratory:  Negative  for cough, shortness of breath and wheezing.   Cardiovascular:  Negative for chest pain, palpitations and leg swelling.  Gastrointestinal:  Negative for blood in stool, constipation, diarrhea, nausea and vomiting.  Endocrine: Negative for cold intolerance, heat intolerance and polyuria.  Genitourinary:  Negative for dyspareunia, dysuria, flank pain, frequency, genital sores, hematuria, menstrual problem, pelvic pain, urgency, vaginal bleeding, vaginal discharge and vaginal pain.  Musculoskeletal:  Negative for back pain, joint swelling and myalgias.  Skin:  Negative for rash.  Neurological:  Negative for dizziness, syncope, light-headedness, numbness and headaches.  Hematological:  Negative for  adenopathy.  Psychiatric/Behavioral:  Negative for agitation, confusion, sleep disturbance and suicidal ideas. The patient is not nervous/anxious.   BREAST: No symptoms   Objective: BP 100/60   Ht 5\' 6"  (1.676 m)   Wt 181 lb (82.1 kg)   LMP 08/30/2022 (Exact Date)   BMI 29.21 kg/m    Physical Exam Constitutional:      Appearance: She is well-developed.  Genitourinary:     Vulva normal.     Genitourinary Comments: EXT VAG EXAM NEG FOR IRRITATION/ERYTHEMA     Right Labia: No rash, tenderness or lesions.    Left Labia: No tenderness, lesions or rash.    No vaginal discharge, erythema or tenderness.      Right Adnexa: not tender and no mass present.    Left Adnexa: not tender and no mass present.    No cervical friability or polyp.     Uterus is not enlarged or tender.  Breasts:    Right: No mass, nipple discharge, skin change or tenderness.     Left: No mass, nipple discharge, skin change or tenderness.  Neck:     Thyroid: No thyromegaly.  Cardiovascular:     Rate and Rhythm: Normal rate and regular rhythm.     Heart sounds: Normal heart sounds. No murmur heard. Pulmonary:     Effort: Pulmonary effort is normal.     Breath sounds: Normal breath sounds.  Abdominal:     Palpations: Abdomen is soft.     Tenderness: There is no abdominal tenderness. There is no guarding or rebound.  Musculoskeletal:        General: Normal range of motion.     Cervical back: Normal range of motion.  Lymphadenopathy:     Cervical: No cervical adenopathy.  Neurological:     General: No focal deficit present.     Mental Status: She is alert and oriented to person, place, and time.     Cranial Nerves: No cranial nerve deficit.  Skin:    General: Skin is warm and dry.  Psychiatric:        Mood and Affect: Mood normal.        Behavior: Behavior normal.        Thought Content: Thought content normal.        Judgment: Judgment normal.  Vitals reviewed.     Assessment/Plan:  Encounter  for annual routine gynecological examination  Encounter for screening mammogram for malignant neoplasm of breast; pt has addl imaging scheduled for LT breast.   Dysmenorrhea--improved this yr; tolerable with heating pad/NSAIDs. F/u prn.   Vaginal irritation--after sex. Try coconut oil instead of olive oil; use OTC hydrocortisone crm ext after sex. If sx persist, will try Rx diflucan in case of yeast. Stop medicated douche  Perimenopause--f/u prn AUB     GYN counsel breast self exam, mammography screening, adequate intake of calcium and vitamin D, diet and exercise  F/U  Return in about 1 year (around 09/30/2023).  Nasha Diss B. Willia Lampert, PA-C 09/29/2022 4:15 PM

## 2022-09-29 NOTE — Patient Instructions (Signed)
I value your feedback and you entrusting us with your care. If you get a [facility] patient survey, I would appreciate you taking the time to let us know about your experience today. Thank you! ? ? ?

## 2022-10-07 ENCOUNTER — Other Ambulatory Visit: Payer: BC Managed Care – PPO

## 2022-10-07 ENCOUNTER — Inpatient Hospital Stay: Admission: RE | Admit: 2022-10-07 | Payer: BC Managed Care – PPO | Source: Ambulatory Visit

## 2022-10-27 ENCOUNTER — Ambulatory Visit
Admission: RE | Admit: 2022-10-27 | Discharge: 2022-10-27 | Disposition: A | Discharge status: Home / Self Care | Payer: BC Managed Care – PPO | Source: Ambulatory Visit | Attending: Obstetrics and Gynecology | Admitting: Obstetrics and Gynecology

## 2022-10-27 DIAGNOSIS — R928 Other abnormal and inconclusive findings on diagnostic imaging of breast: Secondary | ICD-10-CM | POA: Diagnosis present

## 2022-10-27 DIAGNOSIS — N63 Unspecified lump in unspecified breast: Secondary | ICD-10-CM | POA: Diagnosis present

## 2023-09-07 ENCOUNTER — Telehealth: Payer: Self-pay | Admitting: Obstetrics and Gynecology

## 2023-09-07 NOTE — Telephone Encounter (Signed)
Left message for patient to call office back to schedule annual appt with [facility] after 09/30/23

## 2023-10-30 NOTE — Progress Notes (Signed)
 PCP:  Patient, No Pcp Per   Chief Complaint  Patient presents with   Gynecologic Exam    No concerns     HPI:      Leslie Carlson is a 53 y.o. No obstetric history on file. who LMP was No LMP recorded. Patient has had an ablation., presents today for her annual examination.  Her menses are infrequent now, LMP 7/24 for 2 days. Hx of endometrial ablation but was having monthly menses. No dysmen. Has tolerable vasomotor sx.    Sex activity: single partner, contraception - vasectomy. No pain/bleeding/dryness. Last Pap: 07/24/19  Results were: no abnormalities /neg HPV DNA  Hx of STDs: none  Last mammogram: 10/27/22  Results were: normal, repeat in 12 months. Hx of addl views LT breast in past.  There is no FH of breast cancer. There is no FH of ovarian cancer. The patient does self-breast exams.  Tobacco use: The patient denies current or previous tobacco use. Alcohol use: none No drug use.  Exercise: min active  Colonoscopy: never; mom with non-cancerous polyps; neg Cologuard 10/21, repeat due after 3 yrs. Will do colonoscopy this yr.   She does get adequate calcium and Vitamin D in her diet.  Labs with PCP  Past Medical History:  Diagnosis Date   Screening for colon cancer 07/2020   Cologuard NEG; repeat after 3 yrs    Past Surgical History:  Procedure Laterality Date   ENDOMETRIAL ABLATION     HYSTEROSCOPY WITH D & C      Family History  Problem Relation Age of Onset   Diabetes Father    Heart disease Father 47   Breast cancer Neg Hx     Social History   Socioeconomic History   Marital status: Married    Spouse name: Not on file   Number of children: Not on file   Years of education: Not on file   Highest education level: Not on file  Occupational History   Not on file  Tobacco Use   Smoking status: Never   Smokeless tobacco: Never  Vaping Use   Vaping status: Never Used  Substance and Sexual Activity   Alcohol use: Never   Drug use: Never    Sexual activity: Yes    Birth control/protection: None, Surgical    Comment: Ablation/Vasectomy  Other Topics Concern   Not on file  Social History Narrative   Not on file   Social Drivers of Health   Financial Resource Strain: Low Risk  (04/03/2023)   Received from [facility]   Overall Financial Resource Strain (CARDIA)    Difficulty of Paying Living Expenses: Not hard at all  Food Insecurity: No Food Insecurity (04/03/2023)   Received from [facility]   Hunger Vital Sign    Worried About Running Out of Food in the Last Year: Never true    Ran Out of Food in the Last Year: Never true  Transportation Needs: No Transportation Needs (04/03/2023)   Received from [facility] - Transportation    In the past 12 months, has lack of transportation kept you from medical appointments or from getting medications?: No    Lack of Transportation (Non-Medical): No  Physical Activity: Not on file  Stress: Not on file  Social Connections: Not on file  Intimate Partner Violence: Not on file     Current Outpatient Medications:    Cholecalciferol 50 MCG (2000 UT) CAPS, Take  by mouth., Disp: , Rfl:    cyanocobalamin 1000 MCG tablet, Take 2 tablets daily for 2 weeks, then reduce to 1 tablet daily thereafter for Vitamin B12 Deficiency., Disp: , Rfl:    ferrous sulfate 325 (65 FE) MG EC tablet, Take by mouth., Disp: , Rfl:    MAGNESIUM PO, Take by mouth., Disp: , Rfl:      ROS:  Review of Systems  Constitutional:  Negative for fatigue, fever and unexpected weight change.  Respiratory:  Negative for cough, shortness of breath and wheezing.   Cardiovascular:  Negative for chest pain, palpitations and leg swelling.  Gastrointestinal:  Negative for blood in stool, constipation, diarrhea, nausea and vomiting.  Endocrine: Negative for cold intolerance, heat intolerance and polyuria.  Genitourinary:  Negative for dyspareunia, dysuria,  flank pain, frequency, genital sores, hematuria, menstrual problem, pelvic pain, urgency, vaginal bleeding, vaginal discharge and vaginal pain.  Musculoskeletal:  Negative for back pain, joint swelling and myalgias.  Skin:  Negative for rash.  Neurological:  Negative for dizziness, syncope, light-headedness, numbness and headaches.  Hematological:  Negative for adenopathy.  Psychiatric/Behavioral:  Negative for agitation, confusion, sleep disturbance and suicidal ideas. The patient is not nervous/anxious.   BREAST: No symptoms   Objective: BP 109/61   Pulse 76   Ht 5' 6 (1.676 m)   Wt 193 lb (87.5 kg)   BMI 31.15 kg/m    Physical Exam Constitutional:      Appearance: She is well-developed.  Genitourinary:     Vulva normal.     Right Labia: No rash, tenderness or lesions.    Left Labia: No tenderness, lesions or rash.    No vaginal discharge, erythema or tenderness.      Right Adnexa: not tender and no mass present.    Left Adnexa: not tender and no mass present.    No cervical friability or polyp.     Uterus is not enlarged or tender.  Breasts:    Right: No mass, nipple discharge, skin change or tenderness.     Left: No mass, nipple discharge, skin change or tenderness.  Neck:     Thyroid: No thyromegaly.  Cardiovascular:     Rate and Rhythm: Normal rate and regular rhythm.     Heart sounds: Normal heart sounds. No murmur heard. Pulmonary:     Effort: Pulmonary effort is normal.     Breath sounds: Normal breath sounds.  Abdominal:     Palpations: Abdomen is soft.     Tenderness: There is no abdominal tenderness. There is no guarding or rebound.  Musculoskeletal:        General: Normal range of motion.     Cervical back: Normal range of motion.  Lymphadenopathy:     Cervical: No cervical adenopathy.  Neurological:     General: No focal deficit present.     Mental Status: She is alert and oriented to person, place, and time.     Cranial Nerves: No cranial nerve  deficit.  Skin:    General: Skin is warm and dry.  Psychiatric:        Mood and Affect: Mood normal.        Behavior: Behavior normal.        Thought Content: Thought content normal.        Judgment: Judgment normal.  Vitals reviewed.     Assessment/Plan:  Encounter for annual routine gynecological examination  Cervical cancer screening - Plan: Cytology - PAP  Screening for HPV (human papillomavirus) -  Plan: Cytology - PAP  Encounter for screening mammogram for malignant neoplasm of breast - Plan: MM 3D SCREENING MAMMOGRAM BILATERAL BREAST; pt to schedule mammo  Screening for colon cancer - Plan: Ambulatory referral to Gastroenterology; refer to GI  Perimenopause--f/u prn AUB. Pt also s/p ablation but did have monthly menses.     GYN counsel breast self exam, mammography screening, adequate intake of calcium and vitamin D, diet and exercise     F/U  Return in about 1 year (around 10/30/2024).  Scarlet Abad B. Tayten Bergdoll, PA-C 10/31/2023 3:43 PM

## 2023-10-31 ENCOUNTER — Ambulatory Visit (INDEPENDENT_AMBULATORY_CARE_PROVIDER_SITE_OTHER): Payer: BC Managed Care – PPO | Admitting: Obstetrics and Gynecology

## 2023-10-31 ENCOUNTER — Other Ambulatory Visit (HOSPITAL_COMMUNITY)
Admission: RE | Admit: 2023-10-31 | Discharge: 2023-10-31 | Disposition: A | Discharge status: Home / Self Care | Payer: 59 | Source: Ambulatory Visit | Attending: Obstetrics and Gynecology | Admitting: Obstetrics and Gynecology

## 2023-10-31 ENCOUNTER — Encounter: Payer: Self-pay | Admitting: Obstetrics and Gynecology

## 2023-10-31 VITALS — BP 109/61 | HR 76 | Ht 66.0 in | Wt 193.0 lb

## 2023-10-31 DIAGNOSIS — Z01419 Encounter for gynecological examination (general) (routine) without abnormal findings: Secondary | ICD-10-CM | POA: Diagnosis not present

## 2023-10-31 DIAGNOSIS — Z1151 Encounter for screening for human papillomavirus (HPV): Secondary | ICD-10-CM | POA: Insufficient documentation

## 2023-10-31 DIAGNOSIS — N951 Menopausal and female climacteric states: Secondary | ICD-10-CM

## 2023-10-31 DIAGNOSIS — Z124 Encounter for screening for malignant neoplasm of cervix: Secondary | ICD-10-CM | POA: Diagnosis present

## 2023-10-31 DIAGNOSIS — Z1231 Encounter for screening mammogram for malignant neoplasm of breast: Secondary | ICD-10-CM

## 2023-10-31 DIAGNOSIS — Z1211 Encounter for screening for malignant neoplasm of colon: Secondary | ICD-10-CM

## 2023-10-31 NOTE — Patient Instructions (Signed)
 I value your feedback and you entrusting [place] with your care. If you get a [facility] patient survey, I would appreciate you taking the time to let us know about your experience today. Thank you!  [facility] ([place])--[phone]

## 2023-11-02 LAB — CYTOLOGY - PAP
Comment: NEGATIVE
Diagnosis: NEGATIVE
High risk HPV: NEGATIVE

## 2023-11-09 ENCOUNTER — Telehealth: Payer: Self-pay

## 2023-11-09 DIAGNOSIS — Z1211 Encounter for screening for malignant neoplasm of colon: Secondary | ICD-10-CM

## 2023-11-09 NOTE — Telephone Encounter (Signed)
Gastroenterology Pre-Procedure Review  Request Date: TBD the week of June 16th at [facility] Requesting Physician: Dr. Jodelle Gross  PATIENT REVIEW QUESTIONS: The patient responded to the following health history questions as indicated:    1. Are you having any GI issues? no 2. Do you have a personal history of Polyps? no 3. Do you have a family history of Colon Cancer or Polyps? yes (mother colon polyps) 4. Diabetes Mellitus? no 5. Joint replacements in the past 12 months?no 6. Major health problems in the past 3 months?no 7. Any artificial heart valves, MVP, or defibrillator?no    MEDICATIONS & ALLERGIES:    Patient reports the following regarding taking any anticoagulation/antiplatelet therapy:   Plavix, Coumadin, Eliquis, Xarelto, Lovenox, Pradaxa, Brilinta, or Effient? no Aspirin? no  Patient confirms/reports the following medications:  Current Outpatient Medications  Medication Sig Dispense Refill   Cholecalciferol 50 MCG (2000 UT) CAPS Take by mouth.     cyanocobalamin 1000 MCG tablet Take 2 tablets daily for 2 weeks, then reduce to 1 tablet daily thereafter for Vitamin B12 Deficiency.     ferrous sulfate 325 (65 FE) MG EC tablet Take by mouth.     MAGNESIUM PO Take by mouth.     No current facility-administered medications for this visit.    Patient confirms/reports the following allergies:  Allergies  Allergen Reactions   Sulfamethoxazole-Trimethoprim Rash    No orders of the defined types were placed in this encounter.   AUTHORIZATION INFORMATION Primary Insurance: 1D#: Group #:  Secondary Insurance: 1D#: Group #:  SCHEDULE INFORMATION: Date: TBD Time: Location: [facility]

## 2023-11-09 NOTE — Telephone Encounter (Signed)
The patient called in and left a voicemail requesting to schedule colonoscopy. I called her back to let her know we received her message, and  I sent the message to [facility].

## 2023-11-16 ENCOUNTER — Ambulatory Visit
Admission: RE | Admit: 2023-11-16 | Discharge: 2023-11-16 | Disposition: A | Discharge status: Home / Self Care | Payer: 59 | Source: Ambulatory Visit | Attending: Obstetrics and Gynecology | Admitting: Obstetrics and Gynecology

## 2023-11-16 DIAGNOSIS — Z1231 Encounter for screening mammogram for malignant neoplasm of breast: Secondary | ICD-10-CM | POA: Diagnosis present

## 2023-11-20 ENCOUNTER — Encounter: Payer: Self-pay | Admitting: Obstetrics and Gynecology

## 2024-02-27 NOTE — Telephone Encounter (Signed)
 Left voice message to follow up on patient's request to schedule her colonoscopy at [facility] for the week of June 16th.  Informed her that we will not have any providers available to schedule her at [facility], however we can schedule at [facility] on Tue/Wed/ Thur.  Thanks, [place], [place]

## 2024-03-25 ENCOUNTER — Telehealth: Payer: Self-pay

## 2024-03-25 ENCOUNTER — Other Ambulatory Visit: Payer: Self-pay

## 2024-03-25 DIAGNOSIS — Z1211 Encounter for screening for malignant neoplasm of colon: Secondary | ICD-10-CM

## 2024-03-25 MED ORDER — NA SULFATE-K SULFATE-MG SULF 17.5-3.13-1.6 GM/177ML PO SOLN: 1.0000 | Freq: Once | ORAL | 0 refills | Status: AC

## 2024-03-25 NOTE — Telephone Encounter (Signed)
 Gastroenterology Pre-Procedure Review  Request Date: 05/16/24 Requesting Physician: Dr. Ole Berkeley  PATIENT REVIEW QUESTIONS: The patient responded to the following health history questions as indicated:    1. Are you having any GI issues? no 2. Do you have a personal history of Polyps? no 3. Do you have a family history of Colon Cancer or Polyps? yes (mother colon polyps) 4. Diabetes Mellitus? no 5. Joint replacements in the past 12 months?no 6. Major health problems in the past 3 months?no 7. Any artificial heart valves, MVP, or defibrillator?no    MEDICATIONS & ALLERGIES:    Patient reports the following regarding taking any anticoagulation/antiplatelet therapy:   Plavix, Coumadin, Eliquis, Xarelto, Lovenox, Pradaxa, Brilinta, or Effient? no Aspirin? no  Patient confirms/reports the following medications:  Current Outpatient Medications  Medication Sig Dispense Refill   Cholecalciferol 50 MCG (2000 UT) CAPS Take by mouth.     cyanocobalamin 1000 MCG tablet Take 2 tablets daily for 2 weeks, then reduce to 1 tablet daily thereafter for Vitamin B12 Deficiency.     ferrous sulfate 325 (65 FE) MG EC tablet Take by mouth.     MAGNESIUM PO Take by mouth.     No current facility-administered medications for this visit.    Patient confirms/reports the following allergies:  Allergies  Allergen Reactions   Sulfamethoxazole-Trimethoprim Rash    No orders of the defined types were placed in this encounter.   AUTHORIZATION INFORMATION Primary Insurance: 1D#: Group #:  Secondary Insurance: 1D#: Group #:  SCHEDULE INFORMATION: Date: 05/16/24 Time: Location: [facility]

## 2024-04-10 ENCOUNTER — Ambulatory Visit
Admission: EM | Admit: 2024-04-10 | Discharge: 2024-04-10 | Disposition: A | Discharge status: Home / Self Care | Attending: Family Medicine | Admitting: Family Medicine

## 2024-04-10 DIAGNOSIS — L241 Irritant contact dermatitis due to oils and greases: Secondary | ICD-10-CM | POA: Diagnosis not present

## 2024-04-10 MED ORDER — DEXAMETHASONE SODIUM PHOSPHATE 10 MG/ML IJ SOLN
10.0000 mg | Freq: Once | INTRAMUSCULAR | Status: AC
  Administered 2024-04-10: 10 mg via INTRAMUSCULAR

## 2024-04-10 MED ORDER — PREDNISONE 10 MG (21) PO TBPK
ORAL_TABLET | Freq: Every day | ORAL | 0 refills | Status: AC
Start: 2024-04-10 — End: ?

## 2024-04-10 MED ORDER — TRIAMCINOLONE ACETONIDE 0.1 % EX OINT: 1.0000 | TOPICAL_OINTMENT | Freq: Two times a day (BID) | CUTANEOUS | 0 refills | Status: AC

## 2024-04-10 NOTE — ED Provider Notes (Signed)
 [facility]    CSN: [id number] Arrival date & time: 04/10/24  0851      History   Chief Complaint Chief Complaint  Patient presents with   Rash    HPI Leslie Carlson is a 53 y.o. female.   HPI  Leslie Carlson presents for rash that is itchy and red after pulling weeds on Sunday/  Has rash on thighs, face, arms, back with itchy burning eyes. She applied some calamine and Clorox without relief.    There is been no new products including soaps and detergents.  No  sore throat, difficulty breathing, nausea, vomiting or diarrhea.  Denies belly pain, joint pain and fever.  There has been no medication changes or new supplements.  Denies any new foods or drinks.     Past Medical History:  Diagnosis Date   Screening for colon cancer 07/2020   Cologuard NEG; repeat after 3 yrs    There are no active problems to display for this patient.   Past Surgical History:  Procedure Laterality Date   ENDOMETRIAL ABLATION     HYSTEROSCOPY WITH D & C      OB History     Gravida  2   Para  2   Term  1   Preterm  1   AB      Living  2      SAB      IAB      Ectopic      Multiple      Live Births  2            Home Medications    Prior to Admission medications   Medication Sig Start Date End Date Taking? Authorizing Provider  Cholecalciferol 50 MCG (2000 UT) CAPS Take by mouth. 03/29/21  Yes [provider]  ferrous sulfate 325 (65 FE) MG EC tablet Take by mouth.   Yes [provider]  MAGNESIUM PO Take by mouth.   Yes [provider]  predniSONE  (STERAPRED UNI-PAK 21 TAB) 10 MG (21) TBPK tablet Take by mouth daily. Take 6 tabs by mouth daily for 1, then 5 tabs for 1 day, then 4 tabs for 1 day, then 3 tabs for 1 day, then 2 tabs for 1 day, then 1 tab for 1 day. 04/10/24  Yes Dainel Arcidiacono, DO  triamcinolone ointment (KENALOG) 0.1 % Apply 1 Application topically 2 (two) times daily. 04/10/24  Yes Binnie Vonderhaar, DO   cyanocobalamin 1000 MCG tablet Take 2 tablets daily for 2 weeks, then reduce to 1 tablet daily thereafter for Vitamin B12 Deficiency. 03/30/21   [provider]    Family History Family History  Problem Relation Age of Onset   Diabetes Father    Heart disease Father 46   Breast cancer Neg Hx     Social History Social History   Tobacco Use   Smoking status: Never   Smokeless tobacco: Never  Vaping Use   Vaping status: Never Used  Substance Use Topics   Alcohol use: Never   Drug use: Never     Allergies   Sulfamethoxazole-trimethoprim   Review of Systems Review of Systems :negative unless otherwise stated in HPI.      Physical Exam Triage Vital Signs ED Triage Vitals  Encounter Vitals Group     BP 04/10/24 0920 130/78     Girls Systolic BP Percentile --      Girls Diastolic BP Percentile --      Boys Systolic  BP Percentile --      Boys Diastolic BP Percentile --      Pulse Rate 04/10/24 0920 69     Resp 04/10/24 0920 16     Temp 04/10/24 0920 98.3 F (36.8 C)     Temp Source 04/10/24 0920 Oral     SpO2 04/10/24 0920 96 %     Weight --      Height --      Head Circumference --      Peak Flow --      Pain Score 04/10/24 0921 3     Pain Loc --      Pain Education --      Exclude from Growth Chart --    No data found.  Updated Vital Signs BP 130/78 (BP Location: Left Arm)   Pulse 69   Temp 98.3 F (36.8 C) (Oral)   Resp 16   SpO2 96%   Visual Acuity Right Eye Distance:   Left Eye Distance:   Bilateral Distance:    Right Eye Near:   Left Eye Near:    Bilateral Near:     Physical Exam  GEN: alert, well appearing female, in no acute distress  EYES: no scleral injection or discharge CV: regular rate RESP: no increased work of breathing MSK: no extremity edema  NEURO: alert, moves all extremities appropriately SKIN: warm and dry; erythematous patches and papules scattered across body, no drainage or blistering   UC Treatments /  Results  Labs (all labs ordered are listed, but only abnormal results are displayed) Labs Reviewed - No data to display  EKG   Radiology No results found.  Procedures Procedures (including critical care time)  Medications Ordered in UC Medications  dexamethasone (DECADRON) injection 10 mg (10 mg Intramuscular Given 04/10/24 0940)    Initial Impression / Assessment and Plan / UC Course  I have reviewed the triage vital signs and the nursing notes.  Pertinent labs & imaging results that were available during my care of the patient were reviewed by me and considered in my medical decision making (see chart for details).        Contact Dermatitis Patient is a 53 y.o. female who presents for worsening rash for the 2 days.  Overall, patient is well-appearing and well-hydrated.  Vital signs stable.  Leslie Carlson is afebrile.  History and exam concerning for contact dermatitis.  Decadron 10 mg IM given.  Treat with prednisone  taper and steroid ointment.  Claritin or Zyrtec twice a day for additional itch relief. No sign of infection to suggest antifungals or antibiotics at this time.    Reviewed expectations regarding course of current medical issues.  All questions asked were answered.  Outlined signs and symptoms indicating need for more acute intervention. Patient verbalized understanding. After Visit Summary given.   Final Clinical Impressions(s) / UC Diagnoses   Final diagnoses:  Irritant contact dermatitis due to oils     Discharge Instructions      Stop by the pharmacy to pick up your prescriptions.  Follow up with your primary care provider or return to the urgent care, if not improving.       ED Prescriptions     Medication Sig Dispense Auth. Provider   predniSONE  (STERAPRED UNI-PAK 21 TAB) 10 MG (21) TBPK tablet Take by mouth daily. Take 6 tabs by mouth daily for 1, then 5 tabs for 1 day, then 4 tabs for 1 day, then 3 tabs for 1 day,  then 2 tabs for 1  day, then 1 tab for 1 day. 21 tablet Musa Rewerts, DO   triamcinolone ointment (KENALOG) 0.1 % Apply 1 Application topically 2 (two) times daily. 30 g Alissandra Geoffroy, DO      PDMP not reviewed this encounter.              Melenie Minniear, DO 04/10/24 1020

## 2024-04-10 NOTE — Discharge Instructions (Signed)
 Stop by the pharmacy to pick up your prescriptions.  Follow up with your primary care provider or return to the urgent care, if not improving.

## 2024-04-10 NOTE — ED Triage Notes (Signed)
 Red itchy rash to face, ear, back of her neck, arm and legs x 2 days. Using zyrtec and calamine lotion with no relief of symptoms.

## 2024-05-16 ENCOUNTER — Encounter: Payer: Self-pay | Admitting: Gastroenterology

## 2024-05-16 ENCOUNTER — Encounter
Admission: RE | Disposition: A | Discharge status: Home / Self Care | Payer: Self-pay | Source: Home / Self Care | Attending: Gastroenterology

## 2024-05-16 ENCOUNTER — Other Ambulatory Visit: Payer: Self-pay

## 2024-05-16 ENCOUNTER — Ambulatory Visit: Admitting: Certified Registered"

## 2024-05-16 ENCOUNTER — Ambulatory Visit
Admission: RE | Admit: 2024-05-16 | Discharge: 2024-05-16 | Disposition: A | Discharge status: Home / Self Care | Attending: Gastroenterology | Admitting: Gastroenterology

## 2024-05-16 DIAGNOSIS — K640 First degree hemorrhoids: Secondary | ICD-10-CM | POA: Diagnosis not present

## 2024-05-16 DIAGNOSIS — D123 Benign neoplasm of transverse colon: Secondary | ICD-10-CM | POA: Insufficient documentation

## 2024-05-16 DIAGNOSIS — K635 Polyp of colon: Secondary | ICD-10-CM

## 2024-05-16 DIAGNOSIS — Z1211 Encounter for screening for malignant neoplasm of colon: Secondary | ICD-10-CM | POA: Diagnosis present

## 2024-05-16 DIAGNOSIS — Z7952 Long term (current) use of systemic steroids: Secondary | ICD-10-CM | POA: Insufficient documentation

## 2024-05-16 DIAGNOSIS — Z79899 Other long term (current) drug therapy: Secondary | ICD-10-CM | POA: Insufficient documentation

## 2024-05-16 HISTORY — PX: POLYPECTOMY: SHX149

## 2024-05-16 HISTORY — PX: COLONOSCOPY: SHX5424

## 2024-05-16 LAB — POCT PREGNANCY, URINE: Preg Test, Ur: NEGATIVE

## 2024-05-16 SURGERY — COLONOSCOPY
Anesthesia: General

## 2024-05-16 MED ORDER — PROPOFOL 500 MG/50ML IV EMUL
INTRAVENOUS | Status: DC | PRN
  Administered 2024-05-16: 30 mg via INTRAVENOUS
  Administered 2024-05-16 (×2): 50 mg via INTRAVENOUS
  Administered 2024-05-16: 100 ug/kg/min via INTRAVENOUS
  Administered 2024-05-16: 20 mg via INTRAVENOUS

## 2024-05-16 MED ORDER — SODIUM CHLORIDE 0.9 % IV SOLN: INTRAVENOUS | Status: DC

## 2024-05-16 MED ORDER — DEXMEDETOMIDINE HCL IN NACL 200 MCG/50ML IV SOLN
INTRAVENOUS | Status: DC | PRN
  Administered 2024-05-16: 8 ug via INTRAVENOUS

## 2024-05-16 MED ORDER — LIDOCAINE HCL (CARDIAC) PF 100 MG/5ML IV SOSY
PREFILLED_SYRINGE | INTRAVENOUS | Status: DC | PRN
  Administered 2024-05-16: 100 mg via INTRAVENOUS

## 2024-05-16 MED ORDER — PROPOFOL 1000 MG/100ML IV EMUL
INTRAVENOUS | Status: AC
  Filled 2024-05-16: qty 100

## 2024-05-16 NOTE — Anesthesia Postprocedure Evaluation (Signed)
 Anesthesia Post Note  Patient: Leslie Carlson  Procedure(s) Performed: COLONOSCOPY POLYPECTOMY, INTESTINE  Patient location during evaluation: PACU Anesthesia Type: General Level of consciousness: awake and alert Pain management: satisfactory to patient Vital Signs Assessment: post-procedure vital signs reviewed and stable Respiratory status: spontaneous breathing Cardiovascular status: stable Anesthetic complications: no   There were no known notable events for this encounter.   Last Vitals:  Vitals:   05/16/24 0834 05/16/24 0839  BP: 110/68   Pulse:    Resp:    Temp:    SpO2:  100%    Last Pain:  Vitals:   05/16/24 0839  TempSrc:   PainSc: 0-No pain                 VAN STAVEREN,Lynda Capistran

## 2024-05-16 NOTE — H&P (Signed)
 Leslie Copping, MD [facility] [address] [place], [place] [address] Phone: [phone] Fax : [phone]  Primary Care Physician:  Steva Clotilda DEL, NP Primary Gastroenterologist:  Dr. Copping  Pre-Procedure History & Physical: HPI:  Leslie Carlson is a 53 y.o. female is here for a screening colonoscopy.   Past Medical History:  Diagnosis Date   Screening for colon cancer 07/2020   Cologuard NEG; repeat after 3 yrs    Past Surgical History:  Procedure Laterality Date   ENDOMETRIAL ABLATION     HYSTEROSCOPY WITH D & C      Prior to Admission medications   Medication Sig Start Date End Date Taking? Authorizing Provider  Cholecalciferol 50 MCG (2000 UT) CAPS Take by mouth. 03/29/21   [provider]  cyanocobalamin 1000 MCG tablet Take 2 tablets daily for 2 weeks, then reduce to 1 tablet daily thereafter for Vitamin B12 Deficiency. 03/30/21   [provider]  ferrous sulfate 325 (65 FE) MG EC tablet Take by mouth.    [provider]  MAGNESIUM PO Take by mouth.    [provider]  predniSONE  (STERAPRED UNI-PAK 21 TAB) 10 MG (21) TBPK tablet Take by mouth daily. Take 6 tabs by mouth daily for 1, then 5 tabs for 1 day, then 4 tabs for 1 day, then 3 tabs for 1 day, then 2 tabs for 1 day, then 1 tab for 1 day. Patient not taking: Reported on 05/16/2024 04/10/24   Brimage, Vondra, DO  triamcinolone  ointment (KENALOG ) 0.1 % Apply 1 Application topically 2 (two) times daily. Patient not taking: Reported on 05/16/2024 04/10/24   Brimage, Vondra, DO    Allergies as of 03/25/2024 - Review Complete 11/09/2023  Allergen Reaction Noted   Sulfamethoxazole-trimethoprim Rash 03/13/2015    Family History  Problem Relation Age of Onset   Diabetes Father    Heart disease Father 57   Breast cancer Neg Hx     Social History   Socioeconomic History   Marital status: Married    Spouse name: Not on file   Number of children: Not on file   Years of  education: Not on file   Highest education level: Not on file  Occupational History   Not on file  Tobacco Use   Smoking status: Never   Smokeless tobacco: Never  Vaping Use   Vaping status: Never Used  Substance and Sexual Activity   Alcohol use: Never   Drug use: Never   Sexual activity: Yes    Birth control/protection: None, Surgical    Comment: Ablation/Vasectomy  Other Topics Concern   Not on file  Social History Narrative   Not on file   Social Drivers of Health   Financial Resource Strain: Low Risk  (04/03/2024)   Received from [facility]   Overall Financial Resource Strain (CARDIA)    Difficulty of Paying Living Expenses: Not hard at all  Food Insecurity: No Food Insecurity (04/03/2024)   Received from [facility]   Hunger Vital Sign    Within the past 12 months, you worried that your food would run out before you got the money to buy more.: Never true    Within the past 12 months, the food you bought just didn't last and you didn't have money to get more.: Never true  Transportation Needs: No Transportation Needs (04/03/2024)   Received from [facility] - Transportation    In the past  12 months, has lack of transportation kept you from medical appointments or from getting medications?: No    Lack of Transportation (Non-Medical): No  Physical Activity: Insufficiently Active (04/03/2024)   Received from [facility]   Exercise Vital Sign    On average, how many days per week do you engage in moderate to strenuous exercise (like a brisk walk)?: 1 day    On average, how many minutes do you engage in exercise at this level?: 30 min  Stress: No Stress Concern Present (04/03/2024)   Received from [facility] - Occupational Stress Questionnaire    Feeling of Stress : Not at all  Social Connections: Socially Integrated (04/03/2024)    Received from [facility]   Social Connection and Isolation Panel    In a typical week, how many times do you talk on the phone with family, friends, or neighbors?: More than three times a week    How often do you get together with friends or relatives?: More than three times a week    How often do you attend church or religious services?: More than 4 times per year    Do you belong to any clubs or organizations such as church groups, unions, fraternal or athletic groups, or school groups?: Yes    How often do you attend meetings of the clubs or organizations you belong to?: More than 4 times per year    Are you married, widowed, divorced, separated, never married, or living with a partner?: Married  Intimate Partner Violence: Not on file    Review of Systems: See HPI, otherwise negative ROS  Physical Exam: BP 126/76   Pulse 82   Temp (!) 96.8 F (36 C) (Temporal)   Resp 20   Ht 5' 6 (1.676 m)   Wt 84.3 kg   SpO2 98%   BMI 29.99 kg/m  General:   Alert,  pleasant and cooperative in NAD Head:  Normocephalic and atraumatic. Neck:  Supple; no masses or thyromegaly. Lungs:  Clear throughout to auscultation.    Heart:  Regular rate and rhythm. Abdomen:  Soft, nontender and nondistended. Normal bowel sounds, without guarding, and without rebound.   Neurologic:  Alert and  oriented x4;  grossly normal neurologically.  Impression/Plan: [name] is now here to undergo a screening colonoscopy.  Risks, benefits, and alternatives regarding colonoscopy have been reviewed with the patient.  Questions have been answered.  All parties agreeable.

## 2024-05-16 NOTE — Op Note (Signed)
 [facility] Gastroenterology Patient Name: Leslie Carlson Procedure Date: 05/16/2024 7:04 AM MRN: [id number] Account #: [id number] Date of Birth: [birth year] Admit Type: Outpatient Age: 53 Room: [facility] ENDO ROOM 4 Gender: Female Note Status: Finalized Instrument Name: Arvis [id number] Procedure:             Colonoscopy Indications:           Screening for colorectal malignant neoplasm Providers:             [name] MD, MD Referring MD:          steva kirsch Medicines:             Propofol  per Anesthesia Complications:         No immediate complications. Procedure:             Pre-Anesthesia Assessment:                        - Prior to the procedure, a History and Physical was                         performed, and patient medications and allergies were                         reviewed. The patient's tolerance of previous                         anesthesia was also reviewed. The risks and benefits                         of the procedure and the sedation options and risks                         were discussed with the patient. All questions were                         answered, and informed consent was obtained. Prior                         Anticoagulants: The patient has taken no anticoagulant                         or antiplatelet agents. ASA Grade Assessment: II - A                         patient with mild systemic disease. After reviewing                         the risks and benefits, the patient was deemed in                         satisfactory condition to undergo the procedure.                        After obtaining informed consent, the colonoscope was                         passed under direct vision. Throughout the procedure,  the patient's blood pressure, pulse, and oxygen                         saturations were monitored continuously. The                         Colonoscope was introduced through the anus and                          advanced to the the cecum, identified by appendiceal                         orifice and ileocecal valve. The colonoscopy was                         performed without difficulty. The patient tolerated                         the procedure well. The quality of the bowel                         preparation was excellent. Findings:      The perianal and digital rectal examinations were normal.      A 3 mm polyp was found in the transverse colon. The polyp was sessile.       The polyp was removed with a cold snare. Resection and retrieval were       complete.      Non-bleeding internal hemorrhoids were found during retroflexion. The       hemorrhoids were Grade I (internal hemorrhoids that do not prolapse). Impression:            - One 3 mm polyp in the transverse colon, removed with                         a cold snare. Resected and retrieved.                        - Non-bleeding internal hemorrhoids. Recommendation:        - Discharge patient to home.                        - Resume previous diet.                        - Continue present medications.                        - Await pathology results.                        - If the pathology report reveals adenomatous tissue,                         then repeat the colonoscopy for surveillance in 7                         years. Procedure Code(s):     --- Professional ---  54614, Colonoscopy, flexible; with removal of                         tumor(s), polyp(s), or other lesion(s) by snare                         technique Diagnosis Code(s):     --- Professional ---                        Z12.11, Encounter for screening for malignant neoplasm                         of colon                        D12.3, Benign neoplasm of transverse colon (hepatic                         flexure or splenic flexure) CPT copyright 2022 American Medical Association. All rights reserved. The codes documented in  this report are preliminary and upon coder review may  be revised to meet current compliance requirements. [name] MD, MD 05/16/2024 8:03:15 AM This report has been signed electronically. Number of Addenda: 0 Note Initiated On: 05/16/2024 7:04 AM Scope Withdrawal Time: 0 hours 7 minutes 27 seconds  Total Procedure Duration: 0 hours 20 minutes 17 seconds  Estimated Blood Loss:  Estimated blood loss: none.      [facility]

## 2024-05-16 NOTE — Transfer of Care (Signed)
 Immediate Anesthesia Transfer of Care Note  Patient: Leslie Carlson  Procedure(s) Performed: COLONOSCOPY POLYPECTOMY, INTESTINE  Patient Location: PACU  Anesthesia Type:General  Level of Consciousness: drowsy  Airway & Oxygen Therapy: Patient Spontanous Breathing and Patient connected to nasal cannula oxygen  Post-op Assessment: Report given to RN and Post -op Vital signs reviewed and stable  Post vital signs: stable on 4L oxygen.  Last Vitals:  Vitals Value Taken Time  BP 108/67 05/16/24 08:04  Temp    Pulse 66 05/16/24 08:05  Resp 16 05/16/24 08:05  SpO2 100 % 05/16/24 08:05  Vitals shown include unfiled device data.  Last Pain:  Vitals:   05/16/24 0805  TempSrc:   PainSc: 0-No pain         Complications: No notable events documented.

## 2024-05-16 NOTE — Anesthesia Preprocedure Evaluation (Signed)
 Anesthesia Evaluation  Patient identified by MRN, date of birth, ID band Patient awake    Reviewed: Allergy & Precautions, NPO status , Patient's Chart, lab work & pertinent test results  Airway Mallampati: II  TM Distance: >3 FB Neck ROM: Full    Dental  (+) Teeth Intact   Pulmonary neg pulmonary ROS   Pulmonary exam normal        Cardiovascular Exercise Tolerance: Good negative cardio ROS Normal cardiovascular exam Rhythm:Regular Rate:Normal     Neuro/Psych negative neurological ROS  negative psych ROS   GI/Hepatic negative GI ROS, Neg liver ROS,,,  Endo/Other  negative endocrine ROS    Renal/GU negative Renal ROS  negative genitourinary   Musculoskeletal negative musculoskeletal ROS (+)    Abdominal Normal abdominal exam  (+)   Peds negative pediatric ROS (+)  Hematology negative hematology ROS (+)   Anesthesia Other Findings Past Medical History: 07/2020: Screening for colon cancer     Comment:  Cologuard NEG; repeat after 3 yrs  Past Surgical History: No date: ENDOMETRIAL ABLATION No date: HYSTEROSCOPY WITH D & C     Reproductive/Obstetrics negative OB ROS                              Anesthesia Physical Anesthesia Plan  ASA: 1  Anesthesia Plan: General   Post-op Pain Management:    Induction: Intravenous  PONV Risk Score and Plan: Propofol  infusion and TIVA  Airway Management Planned: Natural Airway and Nasal Cannula  Additional Equipment:   Intra-op Plan:   Post-operative Plan:   Informed Consent: I have reviewed the patients History and Physical, chart, labs and discussed the procedure including the risks, benefits and alternatives for the proposed anesthesia with the patient or authorized representative who has indicated his/her understanding and acceptance.     Dental Advisory Given  Plan Discussed with: CRNA  Anesthesia Plan Comments:          Anesthesia Quick Evaluation

## 2024-05-17 LAB — SURGICAL PATHOLOGY

## 2024-05-20 ENCOUNTER — Ambulatory Visit: Payer: Self-pay | Admitting: Gastroenterology

## 2024-10-31 ENCOUNTER — Other Ambulatory Visit: Payer: Self-pay | Admitting: Obstetrics and Gynecology

## 2024-10-31 DIAGNOSIS — Z1231 Encounter for screening mammogram for malignant neoplasm of breast: Secondary | ICD-10-CM

## 2024-12-04 ENCOUNTER — Ambulatory Visit
